# Patient Record
Sex: Male | Born: 1985 | Race: Black or African American | Hispanic: No | Marital: Married | Smoking: Smoker, current status unknown
Health system: Southern US, Community
[De-identification: ages and names within clinical notes are randomized; demographics above are authoritative.]

## PROBLEM LIST (undated history)

## (undated) DIAGNOSIS — M5430 Sciatica, unspecified side: Secondary | ICD-10-CM

## (undated) DIAGNOSIS — M5126 Other intervertebral disc displacement, lumbar region: Secondary | ICD-10-CM

---
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
## 2009-12-08 ENCOUNTER — Emergency Department (HOSPITAL_COMMUNITY): Admission: EM | Admit: 2009-12-08 | Discharge: 2009-12-08 | Payer: Self-pay | Admitting: Family Medicine

## 2010-01-26 ENCOUNTER — Emergency Department (HOSPITAL_COMMUNITY): Admission: EM | Admit: 2010-01-26 | Discharge: 2010-01-26 | Payer: Self-pay | Admitting: Emergency Medicine

## 2014-06-03 ENCOUNTER — Emergency Department (HOSPITAL_COMMUNITY): Payer: Self-pay

## 2014-06-03 ENCOUNTER — Encounter (HOSPITAL_COMMUNITY): Payer: Self-pay | Admitting: Emergency Medicine

## 2014-06-03 ENCOUNTER — Emergency Department (HOSPITAL_COMMUNITY)
Admission: EM | Admit: 2014-06-03 | Discharge: 2014-06-03 | Disposition: A | Discharge status: Home / Self Care | Payer: Self-pay | Attending: Emergency Medicine | Admitting: Emergency Medicine

## 2014-06-03 DIAGNOSIS — Y93G1 Activity, food preparation and clean up: Secondary | ICD-10-CM | POA: Insufficient documentation

## 2014-06-03 DIAGNOSIS — S61411A Laceration without foreign body of right hand, initial encounter: Secondary | ICD-10-CM

## 2014-06-03 DIAGNOSIS — S41109A Unspecified open wound of unspecified upper arm, initial encounter: Secondary | ICD-10-CM | POA: Insufficient documentation

## 2014-06-03 DIAGNOSIS — S61409A Unspecified open wound of unspecified hand, initial encounter: Secondary | ICD-10-CM | POA: Insufficient documentation

## 2014-06-03 DIAGNOSIS — S61209A Unspecified open wound of unspecified finger without damage to nail, initial encounter: Secondary | ICD-10-CM | POA: Insufficient documentation

## 2014-06-03 DIAGNOSIS — W268XXA Contact with other sharp object(s), not elsewhere classified, initial encounter: Secondary | ICD-10-CM | POA: Insufficient documentation

## 2014-06-03 DIAGNOSIS — Y9289 Other specified places as the place of occurrence of the external cause: Secondary | ICD-10-CM | POA: Insufficient documentation

## 2014-06-03 DIAGNOSIS — Z23 Encounter for immunization: Secondary | ICD-10-CM | POA: Insufficient documentation

## 2014-06-03 MED ORDER — CEPHALEXIN 500 MG PO CAPS: 500.0000 mg | ORAL_CAPSULE | Freq: Two times a day (BID) | ORAL | Status: DC

## 2014-06-03 MED ORDER — TETANUS-DIPHTH-ACELL PERTUSSIS 5-2.5-18.5 LF-MCG/0.5 IM SUSP
0.5000 mL | Freq: Once | INTRAMUSCULAR | Status: AC
  Administered 2014-06-03: 0.5 mL via INTRAMUSCULAR
  Filled 2014-06-03: qty 0.5

## 2014-06-03 NOTE — ED Notes (Signed)
Pt states he was washing a glass and it just busted in the water. Has laceration to right hand just below the thumb and to index finger. Small wound to top of hand also.

## 2014-06-03 NOTE — ED Notes (Signed)
Bacitracin applied and bandage applied to wounds.

## 2014-06-03 NOTE — ED Notes (Signed)
PA at the bedside.

## 2014-06-03 NOTE — Discharge Instructions (Signed)
Return to the emergency room with worsening of symptoms or with symptoms that are concerning, especially severe pain, warmth and redness or fevers  Keep wound dry and do not remove dressing for 24 hours if possible. After that, wash gently morning and night (every 12 hours) with soap and water. Use a topical antibiotic ointment and cover with a bandaid or gauze.    Do NOT use rubbing alcohol or hydrogen peroxide, do not soak the area   Present to your primary care doctor or the urgent care of your choice, or the ED for suture removal in 7-10 days.   Every attempt was made to remove foreign body (contaminants) from the wound.  However, there is always a chance that some may remain in the wound. This can  increase your risk of infection.   If you see signs of infection (warmth, redness, tenderness, pus, sharp increase in pain, fever, red streaking in the skin) immediately return to the emergency department.   After the wound heals fully, apply sunscreen for 6-12 months to minimize scarring.

## 2014-06-03 NOTE — ED Provider Notes (Signed)
CSN: [id number]     Arrival date & time 06/03/14  1043 History   This chart was scribed for non-physician practitioner Oswaldo ConroyVictoria Jalise Zawistowski, PA-C, working with Samuel JesterKathleen McManus, DO, by Yevette EdwardsAngela Bracken, ED Scribe. This patient was seen in room TR08C/TR08C and the patient's care was started at 11:02 AM.   First MD Initiated Contact with Patient 06/03/14 1046     Chief Complaint  Patient presents with  . Extremity Laceration   The history is provided by the patient. No language interpreter was used.    HPI Comments: Jeffery SicksJerren M Michna is a 28 y.o. male who presents to the Emergency Department complaining of a laceration to his right hand which occurred this morning while washing a glass dish. He reports pain which he rates as 7/10 associated with the laceration. He denies pain to his wrist or pain radiating up the arm. The pt is able to flex and extend his right hand and all fingers. The bleeding is well-controlled. He is unsure of his last tetanus. Patient denies fevers or weakness.  History reviewed. No pertinent past medical history. History reviewed. No pertinent past surgical history. No family history on file. History  Substance Use Topics  . Smoking status: Never Smoker   . Smokeless tobacco: Not on file  . Alcohol Use: Yes     Comment: occ    Review of Systems  Musculoskeletal: Negative for joint swelling.  Skin: Negative for wound.  Neurological: Negative for weakness and numbness.    A complete 10 system review of systems was obtained, and all systems were negative except where indicated in the HPI and PE.    Allergies  Review of patient's allergies indicates no known allergies.  Home Medications   Prior to Admission medications   Medication Sig Start Date End Date Taking? Authorizing Provider  cephALEXin (KEFLEX) 500 MG capsule Take 1 capsule (500 mg total) by mouth 2 (two) times daily. 06/03/14   Louann SjogrenVictoria L Capri Raben, PA-C   Triage Vitals:  BP 96/69  Pulse 54  Temp(Src) 97.6  F (36.4 C) (Oral)  Resp 20  Ht 5\' 7"  (1.702 m)  Wt 170 lb (77.111 kg)  BMI 26.62 kg/m2  SpO2 98%  Physical Exam  Nursing note and vitals reviewed. Constitutional: He is oriented to person, place, and time. He appears well-developed and well-nourished. No distress.  HENT:  Head: Normocephalic and atraumatic.  Eyes: Conjunctivae and EOM are normal. Right eye exhibits no discharge. Left eye exhibits no discharge. No scleral icterus.  Neck: Neck supple.  Cardiovascular:  2+ radial pulses  Pulmonary/Chest: Effort normal. No respiratory distress.  Musculoskeletal: Normal range of motion.  Right fingers and wrist 5/5 strength with flexion and extension. Moves all fingers.  Neurovascularly intact. Cap refill less than 3 seconds.  Neurological: He is alert and oriented to person, place, and time. Coordination normal.  Skin: Skin is warm and dry. He is not diaphoretic.  2 cm linear laceration to the web space between the right thumb and second finger.  5mm superficial laceration to the dorsal right hand.  5 cm stellate laceration to dorsal right second finger. No warmth or red streaks associated with the wound.  Minimal swelling. No gross contamination or visible foreign bodies.  Psychiatric: He has a normal mood and affect. His behavior is normal.    ED Course  Procedures (including critical care time)  DIAGNOSTIC STUDIES:  Oxygen Saturation is 98% on room air, normal by my interpretation.    COORDINATION OF CARE:  11:09 AM- Discussed treatment plan with patient, and the patient agreed to the plan. The plan includes imaging, suturing, and a tetanus booster. Will also prescribe pt an antibiotic.   12:13 PM- LACERATION REPAIR PROCEDURE NOTE The patient's identification was confirmed and consent was obtained. This procedure was performed by Oswaldo Conroy, PA-C, at 12:13 PM. Site: Web of right hand between thumb and second finger Sterile procedures observe Anesthetic used (type  and amt): 2% lidocaine; 5 mL Suture type/size: 5.0 Prolene  Length: 2 cm  # of Sutures: 3 Technique :simple interrupted  Complexity: simple  Antibx ointment applied Tetanus ordered.  Site anesthetized, irrigated with NS, explored without evidence of foreign body, wound well approximated, site covered with dry, sterile dressing.  Patient tolerated procedure well without complications. Instructions for care discussed verbally and patient provided with additional written instructions for homecare and f/u.  12:18 PM- LACERATION REPAIR PROCEDURE NOTE The patient's identification was confirmed and consent was obtained. This procedure was performed by Oswaldo Conroy, PA-C, at 12:18 PM. Site: Dorsum of right index finger Sterile procedures observed Anesthetic used (type and amt): 2% lidocaine; 5 mL Suture type/size:5.0 Prolene  Length: 3 cm  # of Sutures: 5 Technique:simple interrupted  Complexity: simple  Antibx ointment applied Tetanus ordered.  Site anesthetized, irrigated with NS, explored without evidence of foreign body, wound well approximated, site covered with dry, sterile dressing.  Patient tolerated procedure well without complications. Instructions for care discussed verbally and patient provided with additional written instructions for homecare and f/u.  Labs Review Labs Reviewed - No data to display  Imaging Review Dg Hand Complete Right  06/03/2014   CLINICAL DATA:  Right hand lacerations.  EXAM: RIGHT HAND - COMPLETE 3+ VIEW  COMPARISON:  None.  FINDINGS: Imaged bones, joints and soft tissues appear normal. No radiopaque foreign body is identified.  IMPRESSION: Negative exam.   Electronically Signed   By: Drusilla Kanner M.D.   On: 06/03/2014 11:38     EKG Interpretation None     Meds given in ED:  Medications  Tdap (BOOSTRIX) injection 0.5 mL (0.5 mLs Intramuscular Given 06/03/14 1110)    There are no discharge medications for this patient.     MDM    Final diagnoses:  Hand laceration, right, initial encounter   Patient presents after washing a glass that shattered with 5cm laceration to dorsal right second finger, 2 cm lac to webspace between right thumb and second finger and 5mm lac to dorsal hand. Xray without fracture or foreign body identified. Larger two lacerations were anesthetized, irrigated and sutured. The smaller dorsal 5 mm lac was irrigated but did not require suturing. Patient unsure of last tdap. Administered today in ED. Keflex antibiotics prophylactic script provided. Follow up with urgent care in 7-10 days for suture removal.  Discussed return precautions with patient. Discussed all results and patient verbalizes understanding and agrees with plan.  I personally performed the services described in this documentation, which was scribed in my presence. The recorded information has been reviewed and is accurate.    Louann Sjogren, PA-C 06/03/14 2043

## 2014-06-05 NOTE — ED Provider Notes (Signed)
Medical screening examination/treatment/procedure(s) were performed by non-physician practitioner and as supervising physician I was immediately available for consultation/collaboration.   EKG Interpretation None        Taje Littler, DO 06/05/14 1556 

## 2014-12-18 ENCOUNTER — Emergency Department (HOSPITAL_COMMUNITY)
Admission: EM | Admit: 2014-12-18 | Discharge: 2014-12-18 | Disposition: A | Discharge status: Home / Self Care | Payer: Self-pay | Attending: Emergency Medicine | Admitting: Emergency Medicine

## 2014-12-18 ENCOUNTER — Encounter (HOSPITAL_COMMUNITY): Payer: Self-pay | Admitting: *Deleted

## 2014-12-18 DIAGNOSIS — J069 Acute upper respiratory infection, unspecified: Secondary | ICD-10-CM | POA: Insufficient documentation

## 2014-12-18 DIAGNOSIS — M791 Myalgia: Secondary | ICD-10-CM | POA: Insufficient documentation

## 2014-12-18 LAB — RAPID STREP SCREEN (MED CTR MEBANE ONLY): Streptococcus, Group A Screen (Direct): NEGATIVE

## 2014-12-18 MED ORDER — GUAIFENESIN 100 MG/5ML PO LIQD: 100.0000 mg | ORAL | Status: DC | PRN

## 2014-12-18 MED ORDER — DEXTROMETHORPHAN-BENZOCAINE 5-7.5 MG MT LOZG: 1.0000 | LOZENGE | Freq: Three times a day (TID) | OROMUCOSAL | Status: DC | PRN

## 2014-12-18 NOTE — ED Provider Notes (Signed)
CSN: [id number]     Arrival date & time 12/18/14  1310 History  This chart was scribed for non-physician practitioner, Fayrene HelperBowie Taliana Mersereau, PA-C,working with Flint MelterElliott L Wentz, MD, by Karle PlumberJennifer Tensley, ED Scribe. This patient was seen in room WTR8/WTR8 and the patient's care was started at 3:45 PM.  Chief Complaint  Patient presents with  . Sore Throat  . Generalized Body Aches   Patient is a 29 y.o. male presenting with pharyngitis. The history is provided by the patient and medical records. No language interpreter was used.  Sore Throat    HPI Comments:  Jeffery SicksJerren M Watts is a 29 y.o. male who presents to the Emergency Department complaining of moderate sore throat that began last night. He reports associated chills, generalized body aches and mildly productive cough. He states he has been around small children lately and may have contracted something from them. He has been taking NyQuil last night and DayQuil earlier today with minimal relief of the symptoms. He reports using a throat spray as well with only minimal relief. Denies modifying factors. Denies fever, otalgia, ear discharge, difficulty swallowing, sneezing, nausea, diarrhea or vomiting. Endorses smoking.  History reviewed. No pertinent past medical history. History reviewed. No pertinent past surgical history. No family history on file. History  Substance Use Topics  . Smoking status: Never Smoker   . Smokeless tobacco: Not on file  . Alcohol Use: Yes     Comment: occ    Review of Systems  Constitutional: Positive for chills. Negative for fever.  HENT: Positive for sore throat. Negative for ear discharge, ear pain and trouble swallowing.   Respiratory: Positive for cough.   Gastrointestinal: Negative for nausea, vomiting and diarrhea.  Musculoskeletal: Positive for myalgias (generalized body aches).    Allergies  Review of patient's allergies indicates no known allergies.  Home Medications   Prior to Admission medications    Medication Sig Start Date End Date Taking? Authorizing Provider  DM-Phenylephrine-Acetaminophen 10-5-325 MG/15ML LIQD Take 15 mLs by mouth every 6 (six) hours as needed (cold/flu symptoms.).   Yes Historical Provider, MD  cephALEXin (KEFLEX) 500 MG capsule Take 1 capsule (500 mg total) by mouth 2 (two) times daily. Patient not taking: Reported on 12/18/2014 06/03/14   Louann SjogrenVictoria L Creech, PA-C   Triage Vitals: BP 137/75 mmHg  Pulse 79  Temp(Src) 99.5 F (37.5 C) (Oral)  Resp 18  SpO2 99% Physical Exam  Constitutional: He is oriented to person, place, and time. He appears well-developed and well-nourished.  HENT:  Head: Normocephalic and atraumatic.  Right Ear: Tympanic membrane and ear canal normal.  Left Ear: Tympanic membrane and ear canal normal.  Mouth/Throat: Uvula is midline and mucous membranes are normal. No trismus in the jaw. Oropharyngeal exudate present. No tonsillar abscesses.  Mild tonsillar enlargement with exudate bilaterally. Normal voice.  Eyes: EOM are normal.  Neck: Normal range of motion.  Cardiovascular: Normal rate.   Pulmonary/Chest: Effort normal.  Abdominal: There is no splenomegaly.  Musculoskeletal: Normal range of motion.  Neurological: He is alert and oriented to person, place, and time.  Skin: Skin is warm and dry.  Psychiatric: He has a normal mood and affect. His behavior is normal.  Nursing note and vitals reviewed.   ED Course  Procedures (including critical care time) DIAGNOSTIC STUDIES: Oxygen Saturation is 99% on RA, normal by my interpretation.   COORDINATION OF CARE: 3:50 PM- Will give referral to ENT if symptoms persist. Will treat symptomatically. Pt verbalizes understanding and agrees to  plan. Suspect viral etiology.  Doubt deep tissue infection.    Medications - No data to display  Labs Review Labs Reviewed  RAPID STREP SCREEN  CULTURE, GROUP A STREP    Imaging Review No results found.   EKG Interpretation None      MDM    Final diagnoses:  URI (upper respiratory infection)    BP 137/75 mmHg  Pulse 79  Temp(Src) 99.5 F (37.5 C) (Oral)  Resp 18  SpO2 99%   I personally performed the services described in this documentation, which was scribed in my presence. The recorded information has been reviewed and is accurate.    Fayrene Helper, PA-C 12/18/14 1553  Flint Melter, MD 12/18/14 [phone]

## 2014-12-18 NOTE — Discharge Instructions (Signed)
Upper Respiratory Infection, Adult An upper respiratory infection (URI) is also sometimes known as the common cold. The upper respiratory tract includes the nose, sinuses, throat, trachea, and bronchi. Bronchi are the airways leading to the lungs. Most people improve within 1 week, but symptoms can last up to 2 weeks. A residual cough may last even longer.  CAUSES Many different viruses can infect the tissues lining the upper respiratory tract. The tissues become irritated and inflamed and often become very moist. Mucus production is also common. A cold is contagious. You can easily spread the virus to others by oral contact. This includes kissing, sharing a glass, coughing, or sneezing. Touching your mouth or nose and then touching a surface, which is then touched by another person, can also spread the virus. SYMPTOMS  Symptoms typically develop 1 to 3 days after you come in contact with a cold virus. Symptoms vary from person to person. They may include:  Runny nose.  Sneezing.  Nasal congestion.  Sinus irritation.  Sore throat.  Loss of voice (laryngitis).  Cough.  Fatigue.  Muscle aches.  Loss of appetite.  Headache.  Low-grade fever. DIAGNOSIS  You might diagnose your own cold based on familiar symptoms, since most people get a cold 2 to 3 times a year. Your caregiver can confirm this based on your exam. Most importantly, your caregiver can check that your symptoms are not due to another disease such as strep throat, sinusitis, pneumonia, asthma, or epiglottitis. Blood tests, throat tests, and X-rays are not necessary to diagnose a common cold, but they may sometimes be helpful in excluding other more serious diseases. Your caregiver will decide if any further tests are required. RISKS AND COMPLICATIONS  You may be at risk for a more severe case of the common cold if you smoke cigarettes, have chronic heart disease (such as heart failure) or lung disease (such as asthma), or if  you have a weakened immune system. The very young and very old are also at risk for more serious infections. Bacterial sinusitis, middle ear infections, and bacterial pneumonia can complicate the common cold. The common cold can worsen asthma and chronic obstructive pulmonary disease (COPD). Sometimes, these complications can require emergency medical care and may be life-threatening. PREVENTION  The best way to protect against getting a cold is to practice good hygiene. Avoid oral or hand contact with people with cold symptoms. Wash your hands often if contact occurs. There is no clear evidence that vitamin C, vitamin E, echinacea, or exercise reduces the chance of developing a cold. However, it is always recommended to get plenty of rest and practice good nutrition. TREATMENT  Treatment is directed at relieving symptoms. There is no cure. Antibiotics are not effective, because the infection is caused by a virus, not by bacteria. Treatment may include:  Increased fluid intake. Sports drinks offer valuable electrolytes, sugars, and fluids.  Breathing heated mist or steam (vaporizer or shower).  Eating chicken soup or other clear broths, and maintaining good nutrition.  Getting plenty of rest.  Using gargles or lozenges for comfort.  Controlling fevers with ibuprofen or acetaminophen as directed by your caregiver.  Increasing usage of your inhaler if you have asthma. Zinc gel and zinc lozenges, taken in the first 24 hours of the common cold, can shorten the duration and lessen the severity of symptoms. Pain medicines may help with fever, muscle aches, and throat pain. A variety of non-prescription medicines are available to treat congestion and runny nose. Your caregiver   can make recommendations and may suggest nasal or lung inhalers for other symptoms.  HOME CARE INSTRUCTIONS   Only take over-the-counter or prescription medicines for pain, discomfort, or fever as directed by your  caregiver.  Use a warm mist humidifier or inhale steam from a shower to increase air moisture. This may keep secretions moist and make it easier to breathe.  Drink enough water and fluids to keep your urine clear or pale yellow.  Rest as needed.  Return to work when your temperature has returned to normal or as your caregiver advises. You may need to stay home longer to avoid infecting others. You can also use a face mask and careful hand washing to prevent spread of the virus. SEEK MEDICAL CARE IF:   After the first few days, you feel you are getting worse rather than better.  You need your caregiver's advice about medicines to control symptoms.  You develop chills, worsening shortness of breath, or brown or red sputum. These may be signs of pneumonia.  You develop yellow or brown nasal discharge or pain in the face, especially when you bend forward. These may be signs of sinusitis.  You develop a fever, swollen neck glands, pain with swallowing, or white areas in the back of your throat. These may be signs of strep throat. SEEK IMMEDIATE MEDICAL CARE IF:   You have a fever.  You develop severe or persistent headache, ear pain, sinus pain, or chest pain.  You develop wheezing, a prolonged cough, cough up blood, or have a change in your usual mucus (if you have chronic lung disease).  You develop sore muscles or a stiff neck. Document Released: 03/27/2001 Document Revised: 12/24/2011 Document Reviewed: 01/06/2014 [employer] Patient Information 2015 [employer]. This information is not intended to replace advice given to you by your health care provider. Make sure you discuss any questions you have with your health care provider.  

## 2014-12-18 NOTE — ED Notes (Signed)
Has history of strep, now has difficulty swallowing and gen body aches

## 2014-12-20 LAB — CULTURE, GROUP A STREP: Strep A Culture: NEGATIVE

## 2017-04-25 ENCOUNTER — Emergency Department (HOSPITAL_COMMUNITY): Payer: Self-pay

## 2017-04-25 ENCOUNTER — Emergency Department (HOSPITAL_COMMUNITY)
Admission: EM | Admit: 2017-04-25 | Discharge: 2017-04-25 | Disposition: A | Discharge status: Home / Self Care | Payer: Self-pay | Attending: Emergency Medicine | Admitting: Emergency Medicine

## 2017-04-25 ENCOUNTER — Encounter (HOSPITAL_COMMUNITY): Payer: Self-pay | Admitting: Emergency Medicine

## 2017-04-25 DIAGNOSIS — L247 Irritant contact dermatitis due to plants, except food: Secondary | ICD-10-CM | POA: Insufficient documentation

## 2017-04-25 DIAGNOSIS — Y999 Unspecified external cause status: Secondary | ICD-10-CM | POA: Insufficient documentation

## 2017-04-25 DIAGNOSIS — M542 Cervicalgia: Secondary | ICD-10-CM | POA: Insufficient documentation

## 2017-04-25 DIAGNOSIS — Y929 Unspecified place or not applicable: Secondary | ICD-10-CM | POA: Insufficient documentation

## 2017-04-25 DIAGNOSIS — Y939 Activity, unspecified: Secondary | ICD-10-CM | POA: Insufficient documentation

## 2017-04-25 DIAGNOSIS — M545 Low back pain: Secondary | ICD-10-CM | POA: Insufficient documentation

## 2017-04-25 DIAGNOSIS — M25522 Pain in left elbow: Secondary | ICD-10-CM | POA: Insufficient documentation

## 2017-04-25 MED ORDER — CALAMINE EX LOTN: 1.0000 "application " | TOPICAL_LOTION | CUTANEOUS | 0 refills | Status: DC | PRN

## 2017-04-25 MED ORDER — IBUPROFEN 200 MG PO TABS
600.0000 mg | ORAL_TABLET | Freq: Once | ORAL | Status: AC
  Administered 2017-04-25: 600 mg via ORAL
  Filled 2017-04-25: qty 1

## 2017-04-25 NOTE — ED Notes (Signed)
Patient transported to X-ray 
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
## 2017-04-30 ENCOUNTER — Emergency Department (HOSPITAL_COMMUNITY)
Admission: EM | Admit: 2017-04-30 | Discharge: 2017-04-30 | Disposition: A | Discharge status: Home / Self Care | Payer: Self-pay | Attending: Emergency Medicine | Admitting: Emergency Medicine

## 2017-04-30 ENCOUNTER — Encounter (HOSPITAL_COMMUNITY): Payer: Self-pay | Admitting: Emergency Medicine

## 2017-04-30 DIAGNOSIS — L247 Irritant contact dermatitis due to plants, except food: Secondary | ICD-10-CM

## 2017-04-30 DIAGNOSIS — L03116 Cellulitis of left lower limb: Secondary | ICD-10-CM

## 2017-04-30 DIAGNOSIS — F17210 Nicotine dependence, cigarettes, uncomplicated: Secondary | ICD-10-CM | POA: Insufficient documentation

## 2017-04-30 MED ORDER — HYDROCORTISONE 2.5 % EX LOTN: TOPICAL_LOTION | Freq: Two times a day (BID) | CUTANEOUS | 0 refills | Status: DC

## 2017-04-30 MED ORDER — CEPHALEXIN 500 MG PO CAPS: 500.0000 mg | ORAL_CAPSULE | Freq: Three times a day (TID) | ORAL | 0 refills | Status: AC
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
## 2018-03-29 ENCOUNTER — Inpatient Hospital Stay (HOSPITAL_COMMUNITY)
Admission: AD | Admit: 2018-03-29 | Discharge: 2018-04-01 | DRG: 885 | Disposition: A | Discharge status: Home / Self Care | Payer: Federal, State, Local not specified - Other | Source: Intra-hospital | Attending: Psychiatry | Admitting: Psychiatry

## 2018-03-29 ENCOUNTER — Encounter (HOSPITAL_COMMUNITY): Payer: Self-pay | Admitting: *Deleted

## 2018-03-29 ENCOUNTER — Emergency Department (HOSPITAL_COMMUNITY)
Admission: EM | Admit: 2018-03-29 | Discharge: 2018-03-29 | Disposition: A | Discharge status: Other Acute Inpatient Hospital | Payer: Self-pay | Attending: Emergency Medicine | Admitting: Emergency Medicine

## 2018-03-29 DIAGNOSIS — F1020 Alcohol dependence, uncomplicated: Secondary | ICD-10-CM | POA: Insufficient documentation

## 2018-03-29 DIAGNOSIS — F1994 Other psychoactive substance use, unspecified with psychoactive substance-induced mood disorder: Secondary | ICD-10-CM | POA: Diagnosis not present

## 2018-03-29 DIAGNOSIS — F4310 Post-traumatic stress disorder, unspecified: Secondary | ICD-10-CM | POA: Diagnosis present

## 2018-03-29 DIAGNOSIS — F322 Major depressive disorder, single episode, severe without psychotic features: Principal | ICD-10-CM | POA: Diagnosis present

## 2018-03-29 DIAGNOSIS — F1220 Cannabis dependence, uncomplicated: Secondary | ICD-10-CM | POA: Diagnosis present

## 2018-03-29 DIAGNOSIS — F1490 Cocaine use, unspecified, uncomplicated: Secondary | ICD-10-CM | POA: Diagnosis not present

## 2018-03-29 DIAGNOSIS — Z630 Problems in relationship with spouse or partner: Secondary | ICD-10-CM | POA: Diagnosis not present

## 2018-03-29 DIAGNOSIS — F1420 Cocaine dependence, uncomplicated: Secondary | ICD-10-CM | POA: Diagnosis present

## 2018-03-29 DIAGNOSIS — Z599 Problem related to housing and economic circumstances, unspecified: Secondary | ICD-10-CM | POA: Diagnosis not present

## 2018-03-29 DIAGNOSIS — F323 Major depressive disorder, single episode, severe with psychotic features: Secondary | ICD-10-CM | POA: Insufficient documentation

## 2018-03-29 DIAGNOSIS — Z653 Problems related to other legal circumstances: Secondary | ICD-10-CM | POA: Diagnosis not present

## 2018-03-29 DIAGNOSIS — F1290 Cannabis use, unspecified, uncomplicated: Secondary | ICD-10-CM | POA: Diagnosis not present

## 2018-03-29 DIAGNOSIS — Z886 Allergy status to analgesic agent status: Secondary | ICD-10-CM | POA: Diagnosis not present

## 2018-03-29 DIAGNOSIS — G4700 Insomnia, unspecified: Secondary | ICD-10-CM | POA: Diagnosis present

## 2018-03-29 DIAGNOSIS — R41843 Psychomotor deficit: Secondary | ICD-10-CM | POA: Diagnosis present

## 2018-03-29 DIAGNOSIS — F17290 Nicotine dependence, other tobacco product, uncomplicated: Secondary | ICD-10-CM | POA: Diagnosis present

## 2018-03-29 DIAGNOSIS — F17210 Nicotine dependence, cigarettes, uncomplicated: Secondary | ICD-10-CM | POA: Insufficient documentation

## 2018-03-29 DIAGNOSIS — R45851 Suicidal ideations: Secondary | ICD-10-CM | POA: Diagnosis present

## 2018-03-29 LAB — ACETAMINOPHEN LEVEL: Acetaminophen (Tylenol), Serum: <10 ug/mL — ABNORMAL LOW (ref 10–30)

## 2018-03-29 LAB — CBC
HCT: 44.7 % (ref 39.0–52.0)
HEMOGLOBIN: 15.4 g/dL (ref 13.0–17.0)
MCH: 30.6 pg (ref 26.0–34.0)
MCHC: 34.5 g/dL (ref 30.0–36.0)
MCV: 88.9 fL (ref 78.0–100.0)
PLATELETS: 312 10*3/uL (ref 150–400)
RBC: 5.03 MIL/uL (ref 4.22–5.81)
RDW: 12.7 % (ref 11.5–15.5)
WBC: 6 10*3/uL (ref 4.0–10.5)

## 2018-03-29 LAB — COMPREHENSIVE METABOLIC PANEL
ALBUMIN: 4 g/dL (ref 3.5–5.0)
ALT: 24 U/L (ref 17–63)
AST: 33 U/L (ref 15–41)
Alkaline Phosphatase: 55 U/L (ref 38–126)
Anion gap: 9 (ref 5–15)
BUN: 14 mg/dL (ref 6–20)
CHLORIDE: 107 mmol/L (ref 101–111)
CO2: 26 mmol/L (ref 22–32)
Calcium: 9.1 mg/dL (ref 8.9–10.3)
Creatinine, Ser: 1.2 mg/dL (ref 0.61–1.24)
GFR calc Af Amer: >60 mL/min (ref >60)
GLUCOSE: 83 mg/dL (ref 65–99)
POTASSIUM: 3.9 mmol/L (ref 3.5–5.1)
SODIUM: 142 mmol/L (ref 135–145)
Total Bilirubin: 0.6 mg/dL (ref 0.3–1.2)
Total Protein: 6.8 g/dL (ref 6.5–8.1)

## 2018-03-29 LAB — RAPID URINE DRUG SCREEN, HOSP PERFORMED
AMPHETAMINES: NOT DETECTED
BENZODIAZEPINES: NOT DETECTED
Cocaine: POSITIVE — AB
Opiates: NOT DETECTED
Tetrahydrocannabinol: POSITIVE — AB

## 2018-03-29 LAB — ETHANOL

## 2018-03-29 LAB — SALICYLATE LEVEL: Salicylate Lvl: <7 mg/dL (ref 2.8–30.0)

## 2018-03-29 MED ORDER — NICOTINE 21 MG/24HR TD PT24: 21.0000 mg | MEDICATED_PATCH | Freq: Every day | TRANSDERMAL | Status: DC

## 2018-03-29 MED ORDER — TRAZODONE HCL 100 MG PO TABS
100.0000 mg | ORAL_TABLET | Freq: Every evening | ORAL | Status: DC | PRN
  Administered 2018-03-29: 100 mg via ORAL
  Filled 2018-03-29 (×5): qty 1

## 2018-03-29 MED ORDER — DICYCLOMINE HCL 20 MG PO TABS: 20.0000 mg | ORAL_TABLET | Freq: Four times a day (QID) | ORAL | Status: DC | PRN

## 2018-03-29 MED ORDER — LOPERAMIDE HCL 2 MG PO CAPS: 2.0000 mg | ORAL_CAPSULE | ORAL | Status: DC | PRN

## 2018-03-29 MED ORDER — ONDANSETRON 4 MG PO TBDP: 4.0000 mg | ORAL_TABLET | Freq: Four times a day (QID) | ORAL | Status: DC | PRN

## 2018-03-29 MED ORDER — HYDROXYZINE HCL 25 MG PO TABS
25.0000 mg | ORAL_TABLET | Freq: Four times a day (QID) | ORAL | Status: DC | PRN
  Administered 2018-03-29 – 2018-03-31 (×3): 25 mg via ORAL
  Filled 2018-03-29 (×3): qty 1

## 2018-03-29 MED ORDER — IBUPROFEN 200 MG PO TABS: 600.0000 mg | ORAL_TABLET | Freq: Three times a day (TID) | ORAL | Status: DC | PRN

## 2018-03-29 MED ORDER — MAGNESIUM HYDROXIDE 400 MG/5ML PO SUSP: 30.0000 mL | Freq: Every day | ORAL | Status: DC | PRN

## 2018-03-29 MED ORDER — ALUM & MAG HYDROXIDE-SIMETH 200-200-20 MG/5ML PO SUSP: 30.0000 mL | ORAL | Status: DC | PRN

## 2018-03-29 MED ORDER — METHOCARBAMOL 500 MG PO TABS
500.0000 mg | ORAL_TABLET | Freq: Three times a day (TID) | ORAL | Status: DC | PRN
Start: 2018-03-29 — End: 2018-04-01

## 2018-03-29 NOTE — Progress Notes (Signed)
TTS consulted with Donell SievertSpencer Simon, PA who recommends inpt treatment. [facility] to review for possible placement. Pt's nurse Winfield CunasSadler, Michelle T, RN and EDP Dietrich PatesKhatri, Hina, PA-C advised of the disposition. TTS will continue to monitor.  Princess BruinsAquicha Tevis Dunavan, MSW, LCSW Therapeutic Triage Specialist  [phone]

## 2018-03-29 NOTE — Progress Notes (Signed)
[facility] Attending provider, Dr. Jama Flavorsobos, MD. Call to report [phone]. Contact AC to coordinate time of transfer. Marcelino DusterMichelle, RN notified. [facility].  Jeffery Watts, MSW, LCSW Therapeutic Triage Specialist  [phone]

## 2018-03-29 NOTE — ED Notes (Signed)
Patient approached nursing station and inquired about the length of stay and that he wanted to talk to the assessment counselor. Pt pleasant and cooperative with care. Pt given a sandwich, snacks and soda upon request. No distress noted.

## 2018-03-29 NOTE — ED Triage Notes (Signed)
Pt states he has felt like harming himself for the past couple of days. Pt states he has depression, anxiety, PTSD. Pt called suicide prevention line and was told he should come to ED. Pt states he has never been on psychiatric medication or seen for suicidal ideation.

## 2018-03-29 NOTE — ED Notes (Signed)
Bed: [facility] Expected date:  Expected time:  Means of arrival:  Comments: Hold for triage 3 

## 2018-03-29 NOTE — ED Provider Notes (Signed)
[facility]-EMERGENCY DEPT Provider Note   CSN: [id number] Arrival date & time: 03/29/18  1715     History   Chief Complaint Chief Complaint  Patient presents with  . Suicidal    HPI Philippa SicksJerren M Pogue is a 32 y.o. male who presents to ED for evaluation of suicidal ideations.  He states that he has been having worsening of his depression, PTSD in the past 2 days.  Patient is vague about what exactly has been causing worsening of his symptoms.  He tells me that he has an apparent court case going on for his drug use, as well as problems in his current marriage, and his PTSD from being in prison for several years.  He tells me that he has a plan to harm himself but does not tell me what the specific plan is.  He denies access to firearms.  He denies any auditory or visual hallucinations but states that sometimes I think my conscience is telling me to do things."  He denies any prior suicidal ideations or attempts.  He has never been on any medications for his depression anxiety or PTSD.  He called the suicide prevention hotline and was told to come to the ED.  He currently has 2 children who he cares about.  He reports occasional alcohol and tobacco use, as well as occasional marijuana and cocaine use.  He denies any homicidal ideations, chest pain, shortness of breath, vomiting, other symptoms.  HPI  History reviewed. No pertinent past medical history.  There are no active problems to display for this patient.   History reviewed. No pertinent surgical history.      Home Medications    Prior to Admission medications   Medication Sig Start Date End Date Taking? Authorizing Provider  Multiple Vitamins-Minerals (ECHINACEA ACZ PO) Take 1 tablet by mouth daily.   Yes [provider]  calamine lotion Apply 1 application topically as needed for itching. Patient not taking: Reported on 03/29/2018 04/25/17   Pricilla LovelessGoldston, Scott, MD  Dextromethorphan-Benzocaine (CEPACOL  SORE THROAT & COUGH) 5-7.5 MG LOZG Use as directed 1 lozenge in the mouth or throat every 8 (eight) hours as needed (sore throat). Patient not taking: Reported on 04/25/2017 12/18/14   Fayrene Helperran, Bowie, PA-C  guaiFENesin (ROBITUSSIN) 100 MG/5ML liquid Take 5-10 mLs (100-200 mg total) by mouth every 4 (four) hours as needed for cough. Patient not taking: Reported on 04/25/2017 12/18/14   Fayrene Helperran, Bowie, PA-C  hydrocortisone 2.5 % lotion Apply topically 2 (two) times daily. Patient not taking: Reported on 03/29/2018 04/30/17   Nira Connardama, Pedro Eduardo, MD    Family History No family history on file.  Social History Social History   Tobacco Use  . Smoking status: Current Every Day Smoker    Types: Cigarettes  . Smokeless tobacco: Never Used  Substance Use Topics  . Alcohol use: Yes    Comment: occ  . Drug use: Yes    Types: Marijuana     Allergies   Tylenol [acetaminophen]   Review of Systems Review of Systems  Constitutional: Negative for appetite change, chills and fever.  HENT: Negative for ear pain, rhinorrhea, sneezing and sore throat.   Eyes: Negative for photophobia and visual disturbance.  Respiratory: Negative for cough, chest tightness, shortness of breath and wheezing.   Cardiovascular: Negative for chest pain and palpitations.  Gastrointestinal: Negative for abdominal pain, blood in stool, constipation, diarrhea, nausea and vomiting.  Genitourinary: Negative for dysuria, hematuria and urgency.  Musculoskeletal: Negative  for myalgias.  Skin: Negative for rash.  Neurological: Negative for dizziness, weakness and light-headedness.  Psychiatric/Behavioral: Positive for dysphoric mood and suicidal ideas.     Physical Exam Updated Vital Signs BP 120/80 (BP Location: Left Arm)   Pulse (!) 45   Temp 98.1 F (36.7 C) (Oral)   Resp 18   SpO2 99%   Physical Exam  Constitutional: He appears well-developed and well-nourished. No distress.  HENT:  Head: Normocephalic and  atraumatic.  Nose: Nose normal.  Eyes: Conjunctivae and EOM are normal. Left eye exhibits no discharge. No scleral icterus.  Neck: Normal range of motion. Neck supple.  Cardiovascular: Normal rate, regular rhythm, normal heart sounds and intact distal pulses. Exam reveals no gallop and no friction rub.  No murmur heard. Pulmonary/Chest: Effort normal and breath sounds normal. No respiratory distress.  Abdominal: Soft. Bowel sounds are normal. He exhibits no distension. There is no tenderness. There is no guarding.  Musculoskeletal: Normal range of motion. He exhibits no edema.  Neurological: He is alert. He exhibits normal muscle tone. Coordination normal.  Skin: Skin is warm and dry. No rash noted.  Psychiatric: He has a normal mood and affect. He expresses suicidal ideation. He expresses no homicidal ideation. He expresses suicidal plans. He expresses no homicidal plans.  Nursing note and vitals reviewed.    ED Treatments / Results  Labs (all labs ordered are listed, but only abnormal results are displayed) Labs Reviewed  COMPREHENSIVE METABOLIC PANEL  CBC  ETHANOL  SALICYLATE LEVEL  ACETAMINOPHEN LEVEL  RAPID URINE DRUG SCREEN, HOSP PERFORMED    EKG None  Radiology No results found.  Procedures Procedures (including critical care time)  Medications Ordered in ED Medications - No data to display   Initial Impression / Assessment and Plan / ED Course  I have reviewed the triage vital signs and the nursing notes.  Pertinent labs & imaging results that were available during my care of the patient were reviewed by me and considered in my medical decision making (see chart for details).     32 year old male presents for evaluate ideations.  States he has been having worsening of his depression, PTSD for the past 2 days.  He states that it is a combination of an ongoing court case regarding his drug use, problems in his current marriage and PTSD from being in prison for  several years.  States he has a plan but does not tell me what his specific plan is.  Denies any prior suicide attempts or any attempt at self-harm.  Denies any HI, AVH.  He has never been on any antidepressants or antianxiety medications.  He denies any other symptoms.  Medical screening lab work is unremarkable.  Patient is medically cleared for TTS evaluation.  We will set disposition based on TTS evaluation.  Portions of this note were generated with Scientist, clinical (histocompatibility and immunogenetics). Dictation errors may occur despite best attempts at proofreading.  Final Clinical Impressions(s) / ED Diagnoses   Final diagnoses:  None    ED Discharge Orders    None       Dietrich Pates, PA-C 03/29/18 1935    Little, Ambrose Finland, MD 03/30/18 1447

## 2018-03-29 NOTE — ED Notes (Signed)
Bed: [facility] Expected date:  Expected time:  Means of arrival:  Comments: 

## 2018-03-29 NOTE — ED Notes (Signed)
Patient with no signs of distress noted at this time. Patient spoke with his wife on the phone.
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
## 2018-03-30 ENCOUNTER — Other Ambulatory Visit: Payer: Self-pay

## 2018-03-30 ENCOUNTER — Encounter (HOSPITAL_COMMUNITY): Payer: Self-pay

## 2018-03-30 DIAGNOSIS — F322 Major depressive disorder, single episode, severe without psychotic features: Principal | ICD-10-CM

## 2018-03-30 DIAGNOSIS — Z630 Problems in relationship with spouse or partner: Secondary | ICD-10-CM

## 2018-03-30 DIAGNOSIS — F1490 Cocaine use, unspecified, uncomplicated: Secondary | ICD-10-CM

## 2018-03-30 DIAGNOSIS — F1290 Cannabis use, unspecified, uncomplicated: Secondary | ICD-10-CM

## 2018-03-30 DIAGNOSIS — F1994 Other psychoactive substance use, unspecified with psychoactive substance-induced mood disorder: Secondary | ICD-10-CM

## 2018-03-30 DIAGNOSIS — Z599 Problem related to housing and economic circumstances, unspecified: Secondary | ICD-10-CM

## 2018-03-30 MED ORDER — SERTRALINE HCL 50 MG PO TABS
50.0000 mg | ORAL_TABLET | Freq: Every day | ORAL | Status: DC
  Filled 2018-03-30 (×4): qty 1

## 2018-03-30 MED ORDER — TRAZODONE HCL 50 MG PO TABS
50.0000 mg | ORAL_TABLET | Freq: Every evening | ORAL | Status: DC | PRN
  Filled 2018-03-30: qty 1

## 2018-03-30 NOTE — Progress Notes (Signed)
Writer has observed patient on the phone a lot tonight, he did not attend AA and watched tv briefly before getting back on the phone until dayroom closed. He requested vistaril and not trazodone tonight which he received. Support given and safety maintained on unit with 15 min checks.

## 2018-03-30 NOTE — Progress Notes (Signed)
Pt visible on the miliue, attending groups and meals as scheduled.

## 2018-03-30 NOTE — Progress Notes (Signed)
Pt did not attend goals and orientation group this morning  
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
## 2018-03-30 NOTE — [facility] Suicide Risk Assessment (Addendum)
[facility]   Nursing information obtained from:  Patient Demographic factors:  Male Current Mental Status:  Suicidal ideation indicated by patient, Suicide plan Loss Factors:  Financial problems / change in socioeconomic status, Legal issues Historical Factors:  Family history of mental illness or substance abuse Risk Reduction Factors:  Employed, Responsible for children under 33 years of age, Sense of responsibility to family, Positive social support, Positive coping skills or problem solving skills  Total Time spent with patient: 45 minutes Principal Problem: Substance Abuse, Depression Diagnosis:   Patient Active Problem List   Diagnosis Date Noted  . Substance induced mood disorder [facility](HCC) [F19.94] 03/29/2018   Subjective Data:    Continued Clinical Symptoms:  Alcohol Use Disorder Identification Test Final Score (AUDIT): 6 The "Alcohol Use Disorders Identification Test", Guidelines for Use in Primary Care, Second Edition.  World Science writerHealth Organization [facility](WHO). Score between 0-7:  no or low risk or alcohol related problems. Score between 8-15:  moderate risk of alcohol related problems. Score between 16-19:  high risk of alcohol related problems. Score 20 or above:  warrants further diagnostic evaluation for alcohol dependence and treatment.   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  Presented to the hospital voluntarily reporting worsening depression.  Reports she has been facing significant stressors, mainly financial and marital.  He also reports upcoming court date.He states he has been having recent arguments with his wife and they have been talking about separating.  Reports she has been depressed for "a few days", which he attributes to above mentioned stressors-endorses neurovegetative symptoms to include poor sleep, low energy, mild to moderate anhedonia.  Reports vague suicidal ideations  without plan or intention. Admission blood alcohol level negative, admission urine drug screen positive for cocaine and cannabis.  He has a history of substance abuse and reports daily cannabis use, drinks 4 or 5 days out of the week up to 4, 5 beers per sitting.  States he uses  cocaine irregularly.  He denies any prior psychiatric admissions, denies history of suicide attempts or self-injurious behaviors, denies history of psychosis, denies history of violence or explosiveness, he denies prior history of severe depression  Denies medical history, no medical illnesses, reports he is allergic to acetaminophen.  He was not taking any medications prior to admission.  He is not currently presenting with any symptoms of withdrawal, no tremors, diaphoresis, no acute distress or discomfort  Diagnoses- Substance Induced Mood Disorder, Depressed, versus MDD .   Plan-  inpatient admission Will start Librium PRNs to address symptoms of alcohol withdrawal if needed. Start Zoloft 50 mg daily for depression/anxiety. Check EKG ( bradycardia)        Musculoskeletal: Strength & Muscle Tone: within normal limits-no tremors, no diaphoresis, no restlessness or acute distress Gait & Station: normal Patient leans: N/A  Psychiatric Specialty Exam: Physical Exam  ROS no chest pain, no shortness of breath, no vomiting  Blood pressure 110/73, pulse (!) 48, temperature 97.8 F (36.6 C), temperature source Oral, resp. rate 16, height 5\' 9"  (1.753 m), weight 81.6 kg (180 lb), SpO2 99 %.Body mass index is 26.58 kg/m.  General Appearance: Fairly Groomed  Eye Contact:  Fair  Speech:  Normal Rate  Volume:  Decreased  Mood:  Reports depression, feeling "a little better" today  Affect:  Restricted  Thought Process:  Linear and Descriptions of Associations: Intact  Orientation:  Other:  Fully alert and attentive  Thought Content:  Not internally preoccupied, no delusions expressed, no hallucinations   Suicidal Thoughts:  No today denies any suicidal ideations, denies self-injurious ideations, contracts for safety on unit. Denies homicidal or violent ideations  Homicidal Thoughts:  No  Memory:  Recent and remote grossly intact  Judgement:  Fair  Insight:  Fair  Psychomotor Activity:  Decreased-in bed  Concentration:  Concentration: Fair and Attention Span: Fair  Recall:  Good  Fund of Knowledge:  Good  Language:  Good  Akathisia:  Negative  Handed:  Right  AIMS (if indicated):     Assets:  Desire for Improvement Resilience  ADL's:  Intact  Cognition:  WNL  Sleep:  Number of Hours: 5      COGNITIVE FEATURES THAT CONTRIBUTE TO RISK:  Closed-mindedness and Loss of executive function    SUICIDE RISK:   Moderate:  Frequent suicidal ideation with limited intensity, and duration, some specificity in terms of plans, no associated intent, good self-control, limited dysphoria/symptomatology, some risk factors present, and identifiable protective factors, including available and accessible social support.  PLAN OF CARE: Patient will be admitted to inpatient psychiatric unit for stabilization and safety. Will provide and encourage milieu participation. Provide medication management and maked adjustments as needed.  Will follow daily.    I certify that inpatient services furnished can reasonably be expected to improve the patient's condition.   Craige Cotta, MD 03/30/2018, 2:26 PM
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
## 2018-03-30 NOTE — Progress Notes (Signed)
Patient did not attend the evening speaker AA meeting. Pt was notified that group was beginning but returned to his room.   
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
## 2018-03-30 NOTE — Tx Team (Signed)
Initial Treatment Plan 03/30/2018 1:34 AM Jeffery Watts [id number]    PATIENT STRESSORS: Financial difficulties Legal issue Substance abuse   PATIENT STRENGTHS: Ability for insight Active sense of humor Average or above average intelligence Motivation for treatment/growth Work skills   PATIENT IDENTIFIED PROBLEMS: SI  Depression  Anxiety      " To communicate better by getting stuff out."  " I want to learn more about mental illness."         DISCHARGE CRITERIA:  Improved stabilization in mood, thinking, and/or behavior  PRELIMINARY DISCHARGE PLAN: Return to previous living arrangement  PATIENT/FAMILY INVOLVEMENT: This treatment plan has been presented to and reviewed with the patient, Jeffery Watts, and/or family member.  The patient and family have been given the opportunity to ask questions and make suggestions.  Jeffery Watts, Jeffery Storrs, RN 03/30/2018, 1:34 AM

## 2018-03-30 NOTE — [facility] Group Notes (Signed)
[facility] Group Notes:  (Nursing)  Date:  03/30/2018  Time: 1:15 PM Type of Therapy:  Nurse Education  Participation Level:  Active  Participation Quality:  Appropriate  Affect:  Appropriate  Cognitive:  Appropriate  Insight:  Appropriate  Engagement in Group:  Engaged  Modes of Intervention:  Discussion and Exploration  Summary of Progress/Problems: Group completed suicide safety plan, identified common triggers, healthy coping skills, etc Jeffery NevinValerie Watts Jeffery Watts 03/30/2018, 2:42 PM

## 2018-03-30 NOTE — Progress Notes (Signed)
Pt presents with an anxious mood. Pt speech noted to be loud and pressured during shift assessment. Pt noted to have brief eye contact. Pt appeared bizarre during assessment. Pt verbalized to Clinical research associatewriter, "you got some smokes " and then laughed.  Pt reports decreased racing thoughts at the time of the assessment. Pt expressed that he was given some meds for his anxiety and felt a little drowsy. Pt denies any active suicidal thoughts. Pt isolative to his room and has not attended any scheduled groups.   Orders reviewed with pt. Medications administered as ordered per MD. Verbal support provided. Pt encouraged to attend groups. Suicide risk assessment completed. 15 minute checks performed for safety. Self inventory sheet reviewed.

## 2018-03-30 NOTE — [facility] Group Notes (Signed)
[facility] LCSW Group Therapy Note  03/30/2018  10:00-11:00AM  Type of Therapy and Topic:  Group Therapy:  Being Your Own Support  Participation Level:  Did Not Attend   Description of Group:  Patients in this group were introduced to the concept that self-support is an essential part of recovery.  A song entitled "My Own Hero" was played and a group discussion ensued in which patients stated they could relate to the song and it inspired them to realize they have be willing to help themselves in order to succeed, because other people cannot achieve sobriety or stability for them.  We discussed adding a variety of healthy supports to address the various needs in their lives.  A song was played called "I Know Where I've Been" toward the end of group and used to conduct an inspirational wrap-up to group of remembering how far they have already come in their journey.  Therapeutic Goals: 1)  demonstrate the importance of being a part of one's own support system 2)  discuss reasons people in one's life may eventually be unable to be continually supportive  3)  identify the patient's current support system and   4)  elicit commitments to add healthy supports and to become more conscious of being self-supportive   Summary of Patient Progress:  n/a   Therapeutic Modalities:   Motivational Interviewing Activity  Guilherme Schwenke J Grossman-Orr       

## 2018-03-31 DIAGNOSIS — Z653 Problems related to other legal circumstances: Secondary | ICD-10-CM

## 2018-03-31 MED ORDER — GABAPENTIN 100 MG PO CAPS
100.0000 mg | ORAL_CAPSULE | Freq: Two times a day (BID) | ORAL | Status: DC
  Administered 2018-03-31: 100 mg via ORAL
  Filled 2018-03-31 (×7): qty 1

## 2018-03-31 NOTE — Progress Notes (Signed)
Patient refused zoloft 50 mg this morning.  Patient stated "I don't take medicine during the day."  Patient in bed with head covered with blanket.

## 2018-03-31 NOTE — [facility] Group Notes (Signed)
LCSW Group Therapy Note   03/31/2018 1:15pm   Type of Therapy and Topic:  Group Therapy:  Overcoming Obstacles   Participation Level:  Did Not Attend--pt invited. Chose to remain in bed.    Description of Group:    In this group patients will be encouraged to explore what they see as obstacles to their own wellness and recovery. They will be guided to discuss their thoughts, feelings, and behaviors related to these obstacles. The group will process together ways to cope with barriers, with attention given to specific choices patients can make. Each patient will be challenged to identify changes they are motivated to make in order to overcome their obstacles. This group will be process-oriented, with patients participating in exploration of their own experiences as well as giving and receiving support and challenge from other group members.   Therapeutic Goals: 1. Patient will identify personal and current obstacles as they relate to admission. 2. Patient will identify barriers that currently interfere with their wellness or overcoming obstacles.  3. Patient will identify feelings, thought process and behaviors related to these barriers. 4. Patient will identify two changes they are willing to make to overcome these obstacles:      Summary of Patient Progress   x   Therapeutic Modalities:   Cognitive Behavioral Therapy Solution Focused Therapy Motivational Interviewing Relapse Prevention Therapy  Gustav Knueppel S Wayman Hoard, LCSW 03/31/2018 9:42 AM  

## 2018-03-31 NOTE — Plan of Care (Signed)
Nurse discussed depression, anxiety, coping skills with patient.  

## 2018-03-31 NOTE — Progress Notes (Signed)
[facility] MD Progress Note  03/31/2018 5:41 PM Jeffery Watts  MRN:  [id number] Subjective: Patient states " I am feeling all right".  Denies suicidal ideations . He is currently minimizes depression, denies suicidal ideations, does endorse anxiety and states he feels his major issue is anxiety more than mood disorder/depression. He is currently future oriented and reports being hopeful for discharge soon. Objective : case reviewed with staff and patient seen. 32 year old male, presented voluntarily for worsening depression, reported significant stressors mainly financial, marital, legal.  Reports cannabis abuse and less regular cocaine use .  Today presents with improved mood and currently denies feeling depressed, does not endorse any major neurovegetative symptoms of depression, does endorse some anxiety, worry. He is prescribed Zoloft, as per chart notes declined at this morning, states "I am not feeling depressed".  We reviewed role of SSRI for anxiety management as well. Also reviewed Neurontin as a possible option to address anxiety and pain issues( reports back pain) . Limited milieu/group participation, but becoming more visible on unit this afternoon.  No agitated or overtly disruptive behaviors. Denies suicidal ideations. Expresses hope to be discharged soon.   Currently principal Problem: Substance induced mood disorder ([place]) Diagnosis:   Patient Active Problem List   Diagnosis Date Noted  . Substance induced mood disorder ([place]) [F19.94] 03/29/2018   Total Time spent with patient: 20 minutes  Past Psychiatric History:   Past Medical History: History reviewed. No pertinent past medical history. History reviewed. No pertinent surgical history. Family History: History reviewed. No pertinent family history. Family Psychiatric  History:  Social History:  Social History   Substance and Sexual Activity  Alcohol Use Yes  . Alcohol/week: 2.4 - 3.0 oz  . Types: 4 - 5 Cans of beer per  week   Comment: occasionally     Social History   Substance and Sexual Activity  Drug Use Yes  . Types: Marijuana, Cocaine   Comment: pt reoprts THC daily    Social History   Socioeconomic History  . Marital status: Married    Spouse name: Not on file  . Number of children: Not on file  . Years of education: Not on file  . Highest education level: Not on file  Occupational History  . Not on file  Social Needs  . Financial resource strain: Not on file  . Food insecurity:    Worry: Not on file    Inability: Not on file  . Transportation needs:    Medical: Not on file    Non-medical: Not on file  Tobacco Use  . Smoking status: Smoker, Current Status Unknown    Types: Cigars  . Smokeless tobacco: Never Used  Substance and Sexual Activity  . Alcohol use: Yes    Alcohol/week: 2.4 - 3.0 oz    Types: 4 - 5 Cans of beer per week    Comment: occasionally  . Drug use: Yes    Types: Marijuana, Cocaine    Comment: pt reoprts THC daily  . Sexual activity: Yes    Birth control/protection: None    Comment: occ  Lifestyle  . Physical activity:    Days per week: Not on file    Minutes per session: Not on file  . Stress: Not on file  Relationships  . Social connections:    Talks on phone: Not on file    Gets together: Not on file    Attends religious service: Not on file    Active member of club or  organization: Not on file    Attends meetings of clubs or organizations: Not on file    Relationship status: Not on file  Other Topics Concern  . Not on file  Social History Narrative  . Not on file   Additional Social History:   Sleep: Fair  Appetite:  Improving  Current Medications: Current Facility-Administered Medications  Medication Dose Route Frequency Provider Last Rate Last Dose  . alum & mag hydroxide-simeth (MAALOX/MYLANTA) 200-200-20 MG/5ML suspension 30 mL  30 mL Oral Q4H PRN Patriciaann Clan E, PA-C      . hydrOXYzine (ATARAX/VISTARIL) tablet 25 mg  25 mg Oral  Q6H PRN Patriciaann Clan E, PA-C   25 mg at 03/30/18 2304  . loperamide (IMODIUM) capsule 2-4 mg  2-4 mg Oral PRN Laverle Hobby, PA-C      . magnesium hydroxide (MILK OF MAGNESIA) suspension 30 mL  30 mL Oral Daily PRN Laverle Hobby, PA-C      . methocarbamol (ROBAXIN) tablet 500 mg  500 mg Oral Q8H PRN Laverle Hobby, PA-C      . ondansetron (ZOFRAN-ODT) disintegrating tablet 4 mg  4 mg Oral Q6H PRN Laverle Hobby, PA-C      . sertraline (ZOLOFT) tablet 50 mg  50 mg Oral Daily Renelda Kilian, Myer Peer, MD      . traZODone (DESYREL) tablet 50 mg  50 mg Oral QHS PRN Dorethea Strubel, Myer Peer, MD        Lab Results:  Results for orders placed or performed during the hospital encounter of 03/29/18 (from the past 48 hour(s))  Comprehensive metabolic panel     Status: None   Collection Time: 03/29/18  6:29 PM  Result Value Ref Range   Sodium 142 135 - 145 mmol/L   Potassium 3.9 3.5 - 5.1 mmol/L   Chloride 107 101 - 111 mmol/L   CO2 26 22 - 32 mmol/L   Glucose, Bld 83 65 - 99 mg/dL   BUN 14 6 - 20 mg/dL   Creatinine, Ser 1.20 0.61 - 1.24 mg/dL   Calcium 9.1 8.9 - 10.3 mg/dL   Total Protein 6.8 6.5 - 8.1 g/dL   Albumin 4.0 3.5 - 5.0 g/dL   AST 33 15 - 41 U/L   ALT 24 17 - 63 U/L   Alkaline Phosphatase 55 38 - 126 U/L   Total Bilirubin 0.6 0.3 - 1.2 mg/dL   GFR calc non Af Amer >60 >60 mL/min   GFR calc Af Amer >60 >60 mL/min    Comment: (NOTE) The eGFR has been calculated using the CKD EPI equation. This calculation has not been validated in all clinical situations. eGFR's persistently <60 mL/min signify possible Chronic Kidney Disease.    Anion gap 9 5 - 15    Comment: Performed at [facility], [place] [address] [place], [place] [address]  Ethanol     Status: None   Collection Time: 03/29/18  6:29 PM  Result Value Ref Range   Alcohol, Ethyl (B) <10 <10 mg/dL    Comment: (NOTE) Lowest detectable limit for serum alcohol is 10 mg/dL. For medical purposes only. Performed  at [facility], [place] [address] [place],  [address]   Salicylate level     Status: None   Collection Time: 03/29/18  6:29 PM  Result Value Ref Range   Salicylate Lvl <1.9 2.8 - 30.0 mg/dL    Comment: Performed at [facility], [place] [address] [place],  [place] [address]  Acetaminophen level     Status: Abnormal   Collection Time: 03/29/18  6:29 PM  Result Value Ref Range   Acetaminophen (Tylenol), Serum <10 (L) 10 - 30 ug/mL    Comment: (NOTE) Therapeutic concentrations vary significantly. A range of 10-30 ug/mL  may be an effective concentration for many patients. However, some  are best treated at concentrations outside of this range. Acetaminophen concentrations >150 ug/mL at 4 hours after ingestion  and >50 ug/mL at 12 hours after ingestion are often associated with  toxic reactions. Performed at [facility], [place] [address], [place], [place] [address]   cbc     Status: None   Collection Time: 03/29/18  6:29 PM  Result Value Ref Range   WBC 6.0 4.0 - 10.5 K/uL   RBC 5.03 4.22 - 5.81 MIL/uL   Hemoglobin 15.4 13.0 - 17.0 g/dL   HCT 44.7 39.0 - 52.0 %   MCV 88.9 78.0 - 100.0 fL   MCH 30.6 26.0 - 34.0 pg   MCHC 34.5 30.0 - 36.0 g/dL   RDW 12.7 11.5 - 15.5 %   Platelets 312 150 - 400 K/uL    Comment: Performed at [facility], [place] [address], [place], [place] [address]  Rapid urine drug screen (hospital performed)     Status: Abnormal   Collection Time: 03/29/18  6:50 PM  Result Value Ref Range   Opiates NONE DETECTED NONE DETECTED   Cocaine POSITIVE (A) NONE DETECTED   Benzodiazepines NONE DETECTED NONE DETECTED   Amphetamines NONE DETECTED NONE DETECTED   Tetrahydrocannabinol POSITIVE (A) NONE DETECTED   Barbiturates (A) NONE DETECTED    Result not available. Reagent lot number recalled by manufacturer.    Comment: (NOTE) DRUG SCREEN FOR MEDICAL PURPOSES ONLY.  IF CONFIRMATION IS  NEEDED FOR ANY PURPOSE, NOTIFY LAB WITHIN 5 DAYS. LOWEST DETECTABLE LIMITS FOR URINE DRUG SCREEN Drug Class                     Cutoff (ng/mL) Amphetamine and metabolites    1000 Barbiturate and metabolites    200 Benzodiazepine                 751 Tricyclics and metabolites     300 Opiates and metabolites        300 Cocaine and metabolites        300 THC                            50 Performed at [facility], [place] [address], [place], [place] [address]     Blood Alcohol level:  Lab Results  Component Value Date   ETH <10 27/78/2423    Metabolic Disorder Labs: No results found for: HGBA1C, MPG No results found for: PROLACTIN No results found for: CHOL, TRIG, HDL, CHOLHDL, VLDL, LDLCALC  Physical Findings: AIMS: Facial and Oral Movements Muscles of Facial Expression: None, normal Lips and Perioral Area: None, normal Jaw: None, normal Tongue: None, normal,Extremity Movements Upper (arms, wrists, hands, fingers): None, normal Lower (legs, knees, ankles, toes): None, normal, Trunk Movements Neck, shoulders, hips: None, normal, Overall Severity Severity of abnormal movements (highest score from questions above): None, normal Incapacitation due to abnormal movements: None, normal Patient's awareness of abnormal movements (rate only patient's report): No Awareness, Dental Status Current problems with teeth and/or dentures?: No Does patient usually wear dentures?: No  CIWA:  CIWA-Ar Total: 1 COWS:  COWS Total Score: 1  Musculoskeletal: Strength & Muscle Tone: within normal limits Gait & Station: normal Patient leans: N/A  Psychiatric Specialty Exam: Physical Exam  ROS no chest pain, no shortness of breath, no vomiting, no fever  Blood pressure 122/73, pulse (!) 55, temperature 98.1 F (36.7 C), temperature source Oral, resp. rate 16, height '5\' 9"'  (1.753 m), weight 81.6 kg (180 lb), SpO2 99 %.Body mass index is 26.58 kg/m.  General Appearance: Fairly  Groomed  Eye Contact:  Good  Speech:  Normal Rate  Volume:  Normal  Mood:  Reports improving mood and currently presents euthymic  Affect:  vaguely anxious, more reactive and fuller in range at this time  Thought Process:  Linear and Descriptions of Associations: Intact  Orientation:  Full (Time, Place, and Person)  Thought Content:  No hallucinations, no delusions expressed, not internally preoccupied  Suicidal Thoughts:  No-currently denies suicidal ideations, no homicidal ideations, contracts for safety on unit  Homicidal Thoughts:  No  Memory:  Recent and remote grossly intact  Judgement:  Fair  Insight:  Fair  Psychomotor Activity:  Normal  Concentration:  Concentration: Good and Attention Span: Good  Recall:  Good  Fund of Knowledge:  Good  Language:  Good  Akathisia:  Negative  Handed:  Right  AIMS (if indicated):     Assets:  Communication Skills Desire for Improvement Resilience  ADL's:  Intact  Cognition:  WNL  Sleep:  Number of Hours: 4.5   Assessment -patient reports improved mood and currently minimizes depression.  Affect presents fuller in range.  Endorses anxiety, partly related to current psychosocial stressors.  Denies suicidal ideations.  Focused on being discharged soon.     Treatment Plan Summary: Daily contact with patient to assess and evaluate symptoms and progress in treatment, Medication management, Plan Inpatient treatment and Medications as below Encourage group and milieu participation to work on coping skills and symptom reduction Encourage efforts to work on sobriety and relapse prevention. Continue Zoloft 50 mg daily for mood and anxiety symptoms Start Neurontin 100 mgrs BID for anxiety, pain Continue Trazodone 50 mgrs QHS PRN for insomnia  Continue Hydroxyzine 25 mgr Q 6 hours PRN for anxiety Treatment team working on disposition planning options Jenne Campus, MD 03/31/2018, 5:41 PM

## 2018-03-31 NOTE — Progress Notes (Signed)
Recreation Therapy Notes  Date: 6.17.19 Time: 0930 Location: [address] Dayroom  Group Topic: Stress Management  Goal Area(s) Addresses:  Patient will verbalize importance of using healthy stress management.  Patient will identify positive emotions associated with healthy stress management.   Intervention: Stress Management  Activity :  Choice Meditation.  LRT played a meditation on choice.  Patients were to follow along as meditation was played to engage in activity.  Education:  Stress Management, Discharge Planning.   Education Outcome: Acknowledges edcuation/In group clarification offered/Needs additional education  Clinical Observations/Feedback: Pt did not attend group.    Karsten Howry, LRT/CTRS         Kamryn Gauthier A 03/31/2018 11:56 AM 

## 2018-03-31 NOTE — Progress Notes (Signed)
D:  Patient denied SI and HI today.  Denied A/V hallucinations.  Contracts for safety.   A:  Patient refused zoloft 50 mg this morning.  Patient stated he only takes night medications.Emotional support and encouragement given patient.  A:  Safety maintained with 15 minute checks.

## 2018-03-31 NOTE — Tx Team (Signed)
Interdisciplinary Treatment and Diagnostic Plan Update  03/31/2018 Time of Session:  0830AM Jeffery Watts MRN: [id number]  Principal Diagnosis: Substance induced mood disorder (HCC)  Secondary Diagnoses: Principal Problem:   Substance induced mood disorder (HCC)   Current Medications:  Current Facility-Administered Medications  Medication Dose Route Frequency Provider Last Rate Last Dose  . alum & mag hydroxide-simeth (MAALOX/MYLANTA) 200-200-20 MG/5ML suspension 30 mL  30 mL Oral Q4H PRN Donell Sievert E, PA-C      . hydrOXYzine (ATARAX/VISTARIL) tablet 25 mg  25 mg Oral Q6H PRN Donell Sievert E, PA-C   25 mg at 03/30/18 2304  . loperamide (IMODIUM) capsule 2-4 mg  2-4 mg Oral PRN Donell Sievert E, PA-C      . magnesium hydroxide (MILK OF MAGNESIA) suspension 30 mL  30 mL Oral Daily PRN Kerry Hough, PA-C      . methocarbamol (ROBAXIN) tablet 500 mg  500 mg Oral Q8H PRN Kerry Hough, PA-C      . ondansetron (ZOFRAN-ODT) disintegrating tablet 4 mg  4 mg Oral Q6H PRN Kerry Hough, PA-C      . sertraline (ZOLOFT) tablet 50 mg  50 mg Oral Daily Cobos, Rockey Situ, MD      . traZODone (DESYREL) tablet 50 mg  50 mg Oral QHS PRN Cobos, Rockey Situ, MD       PTA Medications: Medications Prior to Admission  Medication Sig Dispense Refill Last Dose  . calamine lotion Apply 1 application topically as needed for itching. (Patient not taking: Reported on 03/29/2018) 120 mL 0 Unknown at Unknown time  . Dextromethorphan-Benzocaine (CEPACOL SORE THROAT & COUGH) 5-7.5 MG LOZG Use as directed 1 lozenge in the mouth or throat every 8 (eight) hours as needed (sore throat). (Patient not taking: Reported on 04/25/2017) 16 each 0 Unknown at Unknown time  . guaiFENesin (ROBITUSSIN) 100 MG/5ML liquid Take 5-10 mLs (100-200 mg total) by mouth every 4 (four) hours as needed for cough. (Patient not taking: Reported on 04/25/2017) 60 mL 0 Unknown at Unknown time  . hydrocortisone 2.5 % lotion Apply  topically 2 (two) times daily. (Patient not taking: Reported on 03/29/2018) 59 mL 0 Unknown at Unknown time  . Multiple Vitamins-Minerals (ECHINACEA ACZ PO) Take 1 tablet by mouth daily.   Unknown at Unknown time    Patient Stressors: Financial difficulties Legal issue Substance abuse  Patient Strengths: Ability for insight Active sense of humor Average or above average intelligence Motivation for treatment/growth Work skills  Treatment Modalities: Medication Management, Group therapy, Case management,  1 to 1 session with clinician, Psychoeducation, Recreational therapy.   Physician Treatment Plan for Primary Diagnosis: Substance induced mood disorder (HCC) Long Term Goal(s): Improvement in symptoms so as ready for discharge Improvement in symptoms so as ready for discharge   Short Term Goals: Ability to identify and develop effective coping behaviors will improve Ability to maintain clinical measurements within normal limits will improve Compliance with prescribed medications will improve Ability to identify triggers associated with substance abuse/mental health issues will improve Ability to identify changes in lifestyle to reduce recurrence of condition will improve Ability to verbalize feelings will improve Ability to disclose and discuss suicidal ideas Ability to demonstrate self-control will improve  Medication Management: Evaluate patient's response, side effects, and tolerance of medication regimen.  Therapeutic Interventions: 1 to 1 sessions, Unit Group sessions and Medication administration.  Evaluation of Outcomes: Progressing  Physician Treatment Plan for Secondary Diagnosis: Principal Problem:   Substance induced mood disorder (  HCC)  Long Term Goal(s): Improvement in symptoms so as ready for discharge Improvement in symptoms so as ready for discharge   Short Term Goals: Ability to identify and develop effective coping behaviors will improve Ability to maintain  clinical measurements within normal limits will improve Compliance with prescribed medications will improve Ability to identify triggers associated with substance abuse/mental health issues will improve Ability to identify changes in lifestyle to reduce recurrence of condition will improve Ability to verbalize feelings will improve Ability to disclose and discuss suicidal ideas Ability to demonstrate self-control will improve     Medication Management: Evaluate patient's response, side effects, and tolerance of medication regimen.  Therapeutic Interventions: 1 to 1 sessions, Unit Group sessions and Medication administration.  Evaluation of Outcomes: Progressing   RN Treatment Plan for Primary Diagnosis: Substance induced mood disorder (HCC) Long Term Goal(s): Knowledge of disease and therapeutic regimen to maintain health will improve  Short Term Goals: Ability to remain free from injury will improve, Ability to verbalize frustration and anger appropriately will improve and Ability to identify and develop effective coping behaviors will improve  Medication Management: RN will administer medications as ordered by provider, will assess and evaluate patient's response and provide education to patient for prescribed medication. RN will report any adverse and/or side effects to prescribing provider.  Therapeutic Interventions: 1 on 1 counseling sessions, Psychoeducation, Medication administration, Evaluate responses to treatment, Monitor vital signs and CBGs as ordered, Perform/monitor CIWA, COWS, AIMS and Fall Risk screenings as ordered, Perform wound care treatments as ordered.  Evaluation of Outcomes: Progressing   LCSW Treatment Plan for Primary Diagnosis: Substance induced mood disorder (HCC) Long Term Goal(s): Safe transition to appropriate next level of care at discharge, Engage patient in therapeutic group addressing interpersonal concerns.  Short Term Goals: Engage patient in  aftercare planning with referrals and resources, Facilitate patient progression through stages of change regarding substance use diagnoses and concerns and Identify triggers associated with mental health/substance abuse issues  Therapeutic Interventions: Assess for all discharge needs, 1 to 1 time with Social worker, Explore available resources and support systems, Assess for adequacy in community support network, Educate family and significant other(s) on suicide prevention, Complete Psychosocial Assessment, Interpersonal group therapy.  Evaluation of Outcomes: Progressing   Progress in Treatment: Attending groups: Yes. Participating in groups: Yes. Taking medication as prescribed: Yes. Toleration medication: Yes. Family/Significant other contact made: SPE completed with pt; pt declined to consent to collateral contact.  Patient understands diagnosis: Yes. Discussing patient identified problems/goals with staff: Yes. Medical problems stabilized or resolved: Yes. Denies suicidal/homicidal ideation: Yes. Issues/concerns per patient self-inventory: No. Other: n/a  New problem(s) identified: No, Describe:  n/a  New Short Term/Long Term Goal(s): detox, medication management for mood stabilization; elimination of SI thoughts; development of comprehensive mental wellness/sobriety plan.   Patient Goals:  "to learn how to communicate better and find out if I have a mental illness."  Discharge Plan or Barriers: CSW assessing for appropriate referrals. MHAG pamphlet, Mobile Crisis information, and AA/NA information provided to patient for additional community support and resources.   Reason for Continuation of Hospitalization: Anxiety Depression Medication stabilization Suicidal ideation Withdrawal symptoms  Estimated Length of Stay: Wed, 04/02/18  Attendees: Patient: Jeffery Watts 03/31/2018 8:40 AM  Physician: Dr. Altamese Carolinaainville MD; Dr. Jama Flavorsobos MD 03/31/2018 8:40 AM  Nursing: Erskine SquibbJane RN; OcillaBeverly RN  03/31/2018 8:40 AM  RN Care Manager:x 03/31/2018 8:40 AM  Social Worker: Corrie MckusickHeather Charlsey Moragne LCSW 03/31/2018 8:40 AM  Recreational Therapist: x 03/31/2018 8:40  AM  Other: Armandina Stammer NP 03/31/2018 8:40 AM  Other:  03/31/2018 8:40 AM  Other: 03/31/2018 8:40 AM    Scribe for Treatment Team: Rona Ravens, LCSW 03/31/2018 8:40 AM

## 2018-03-31 NOTE — Progress Notes (Signed)
Gabapentin 100 mg given to patient after dinner per MD order.  Patient was given written medication information about gabapentin.  Patient stated "I will take this medicine now to see how it works for me."  Respirations even and unlabored.  No signs/symptoms of pain/distress noted on patient's face/body movements.   Safety maintained with 15 minute checks.  Patient continues to talk to staff about giving his credit card to girlfriend, etc.  Patient has recently gambled away large sum of money, note says thousands of dollars.

## 2018-04-01 MED ORDER — HYDROXYZINE HCL 25 MG PO TABS: 25.0000 mg | ORAL_TABLET | Freq: Four times a day (QID) | ORAL | 0 refills | Status: AC | PRN

## 2018-04-01 MED ORDER — HYDROCORTISONE 2.5 % EX LOTN: TOPICAL_LOTION | Freq: Two times a day (BID) | CUTANEOUS | 0 refills | Status: AC

## 2018-04-01 MED ORDER — CALAMINE EX LOTN: 1.0000 "application " | TOPICAL_LOTION | CUTANEOUS | 0 refills | Status: AC | PRN

## 2018-04-01 NOTE — Progress Notes (Signed)
D:  Jeffery Watts has been up and visible on the unit.  He did attend evening AA group and was noted sitting in the day room not interacting with peers. He denied any SI/HI or A/V hallucinations.  He denied any depressive symptoms and stated that he was doing "fine."  He did finally request something for anxiety at bedtime to help him relax.  He was labile and could get loud at times.   He denied any pain or discomfort and appeared to be in no physical distress.  He reported an "ok" visit this evening with his family.  He made no mention of wanting to get his credit card out of the locker. A:  1:1 with RN for support and encouragement.  Medications as ordered.  Q 15 minute checks maintained for safety.  Encouraged participation in group and unit activities.   R:  Tristyn remains safe on the unit.  We will continue to monitor the progress towards his goals.

## 2018-04-01 NOTE — [facility] Suicide Risk Assessment (Signed)
[facility]   Principal Problem: Substance induced mood disorder [facility](HCC) Discharge Diagnoses:  Patient Active Problem List   Diagnosis Date Noted  . Substance induced mood disorder (HCC) [F19.94] 03/29/2018    Total Time spent with patient: 30 minutes  Musculoskeletal: Strength & Muscle Tone: within normal limits Gait & Station: normal Patient leans: N/A  Psychiatric Specialty Exam: Review of Systems  All other systems reviewed and are negative.   Blood pressure 109/70, pulse (!) 50, temperature 98.2 F (36.8 C), temperature source Oral, resp. rate 16, height 5\' 9"  (1.753 m), weight 81.6 kg (180 lb), SpO2 99 %.Body mass index is 26.58 kg/m.  General Appearance: Casual  Eye Contact::  Good  Speech:  Clear and Coherent409  Volume:  Normal  Mood:  Euthymic  Affect:  Congruent  Thought Process:  Coherent  Orientation:  Full (Time, Place, and Person)  Thought Content:  Logical  Suicidal Thoughts:  No  Homicidal Thoughts:  No  Memory:  Immediate;   Fair Recent;   Fair Remote;   Fair  Judgement:  Good  Insight:  Fair  Psychomotor Activity:  Normal  Concentration:  Good  Recall:  Good  Fund of Knowledge:Fair  Language: Good  Akathisia:  Negative  Handed:  Right  AIMS (if indicated):     Assets:  Communication Skills Desire for Improvement Housing Leisure Time Physical Health Resilience  Sleep:  Number of Hours: 4.75  Cognition: WNL  ADL's:  Intact   Mental Status Per Nursing Assessment::   On Admission:  Suicidal ideation indicated by patient, Suicide plan  Demographic Factors:  Male, Low socioeconomic status and Unemployed  Loss Factors: NA  Historical Factors: Impulsivity  Risk Reduction Factors:   Positive social support  Continued Clinical Symptoms:  Alcohol/Substance Abuse/Dependencies  Cognitive Features That Contribute To Risk:  None    Suicide Risk:  Minimal: No identifiable suicidal ideation.  Patients presenting  with no risk factors but with morbid ruminations; may be classified as minimal risk based on the severity of the depressive symptoms  Follow-up Information    [facility]. Go on 04/02/2018.   Specialty:  Behavioral Health Why:  Appointment is 04/02/18 at 8:15am.  Contact information: [address] [place] [address] [phone]           Plan Of Care/Follow-up recommendations:  Activity:  ad lib. Diet:  Regular  Antonieta PertGreg Lawson Anavey Coombes, MD 04/01/2018, 12:59 PM

## 2018-04-01 NOTE — Plan of Care (Signed)
[place] did attend evening AA group after not attending any groups today.

## 2018-04-01 NOTE — Progress Notes (Signed)
Discharge note:  Patient discharged home per MD order.  Patient received all personal belongings from locker and unit.  Reviewed AVS/transition record with patient and he indicated understanding.  He denies any thoughts of self harm.  Patient received a prescription for vistaril and will follow up with [facility].  Patient left ambulatory with 2 bus passes.

## 2018-04-01 NOTE — Progress Notes (Signed)
Patient has refused all medications this morning.  Patient denied SI and HI, contracts for safety.  Denied A/V hallucinations.  Respirations even and unlabored.  No signs/symptoms of pain/distress noted on patient's face/body movements.  Safety maintained with 15 minute checks. Patient has stayed in bed all morning.  Patient did go to dining room for lunch.

## 2018-04-01 NOTE — Discharge Summary (Addendum)
Physician Discharge Summary Note  Patient:  Jeffery Watts is an 32 y.o., male MRN:  [id number] DOB:  1986/03/14 Patient phone:  [phone] (home)  Patient address:   [address] [place] [address],  Total Time spent with patient: Greater than 30 minutes  Date of Admission:  03/29/2018  Date of Discharge: 04-01-18  Reason for Admission: Worsening stress & symptoms of depression.  Principal Problem: Substance induced mood disorder [facility])  Discharge Diagnoses: Patient Active Problem List   Diagnosis Date Noted  . Substance induced mood disorder [facility]) [F19.94] 03/29/2018   Past Psychiatric History: Substance induced mood disorder  Past Medical History: History reviewed. No pertinent past medical history. History reviewed. No pertinent surgical history.  Family History: History reviewed. No pertinent family history.  \Family Psychiatric  History: See H&P  Social History:  Social History   Substance and Sexual Activity  Alcohol Use Yes  . Alcohol/week: 2.4 - 3.0 oz  . Types: 4 - 5 Cans of beer per week   Comment: occasionally     Social History   Substance and Sexual Activity  Drug Use Yes  . Types: Marijuana, Cocaine   Comment: pt reoprts THC daily    Social History   Socioeconomic History  . Marital status: Married    Spouse name: Not on file  . Number of children: Not on file  . Years of education: Not on file  . Highest education level: Not on file  Occupational History  . Not on file  Social Needs  . Financial resource strain: Not on file  . Food insecurity:    Worry: Not on file    Inability: Not on file  . Transportation needs:    Medical: Not on file    Non-medical: Not on file  Tobacco Use  . Smoking status: Smoker, Current Status Unknown    Types: Cigars  . Smokeless tobacco: Never Used  Substance and Sexual Activity  . Alcohol use: Yes    Alcohol/week: 2.4 - 3.0 oz    Types: 4 - 5 Cans of beer per week    Comment: occasionally  .  Drug use: Yes    Types: Marijuana, Cocaine    Comment: pt reoprts THC daily  . Sexual activity: Yes    Birth control/protection: None    Comment: occ  Lifestyle  . Physical activity:    Days per week: Not on file    Minutes per session: Not on file  . Stress: Not on file  Relationships  . Social connections:    Talks on phone: Not on file    Gets together: Not on file    Attends religious service: Not on file    Active member of club or organization: Not on file    Attends meetings of clubs or organizations: Not on file    Relationship status: Not on file  Other Topics Concern  . Not on file  Social History Narrative  . Not on file   Hospital Course: (Per admission assessment): Jeffery Watts an 32 y.o.malewho presents to the ED voluntarily.Pt reports he has been increasingly depressed and overwhelmed with stress for the past 2 years. Pt states things got worse today and he began having thoughts of suicide. Pt states this scared him because he has never had any psych hx, however he was thinking about different ways to kill himself. Pt states he experienced racing thoughts including "you should just kill yourself, you will be a coward, what about your  children, what will they think?" Pt states this scared him so he decided to come to the ED for help.  After the above admission assessment, Jeffery Watts was started on the medication regimen for his presenting symptoms. The benefit & adverse effects of these medications were discussed & reviewed with him. The medications include; Gabapentin 100 mg for agitation, Vistaril 25 mg prn for anxiety, Sertraline 50 mg for depression & Trazodone 50 mg for insomnia. He was also enrolled & participated in the group counseling sessions being offered & held on this unit. He learned coping skills that should help him after discharge to cope better.  During the course of his hospitalization, Jeffery Watts was adamant about not wanting to be on any medications  because he stated that he was not really depressed, rather more anxious than anything else. He thinks he could function well without the depression & other medicines. He is optimistic about the future without depression medicines. He has decided to go home with just the Vistaril for anxiety as he believed, that was his major issues. He denies any psychotic features. No craving for substances. No severe anxiety symptoms. The features of depression has markedly improved since coming off the substances. No thoughts of violence. No access to weapons. No new stressors.   Jeffery Watts's case was presented during treatment team meeting this morning. The nursing staff reports that patient has been appropriate on the unit. Patient has been interacting well with peers. No behavioral issues. Patient has not voiced any suicidal thoughts. Patient has not been observed to be internally stimulated or preoccupied. The team members feel that patient is back to his baseline level of function. The team agrees with plan to discharge patient today to continue mental health care on an outpatient basis as noted below.  He is provided with all the necessary information needed to make this appointment without any problems. He left [facility] with all personal belongings in no apparent distress. [facility] assisted with transportation fare for the city bus.  Physical Findings: AIMS: Facial and Oral Movements Muscles of Facial Expression: None, normal Lips and Perioral Area: None, normal Jaw: None, normal Tongue: None, normal,Extremity Movements Upper (arms, wrists, hands, fingers): None, normal Lower (legs, knees, ankles, toes): None, normal, Trunk Movements Neck, shoulders, hips: None, normal, Overall Severity Severity of abnormal movements (highest score from questions above): None, normal Incapacitation due to abnormal movements: None, normal Patient's awareness of abnormal movements (rate only patient's report): No Awareness, Dental  Status Current problems with teeth and/or dentures?: No Does patient usually wear dentures?: No  CIWA:  CIWA-Ar Total: 1 COWS:  COWS Total Score: 1  Musculoskeletal: Strength & Muscle Tone: within normal limits Gait & Station: normal Patient leans: N/A  Psychiatric Specialty Exam: Physical Exam  Constitutional: He appears well-developed.  HENT:  Head: Normocephalic.  Eyes: Pupils are equal, round, and reactive to light.  Neck: Normal range of motion.  Cardiovascular: Normal rate.  Respiratory: Effort normal.  GI: Soft.  Genitourinary:  Genitourinary Comments: Deferred  Musculoskeletal: Normal range of motion.  Neurological: He is alert.  Skin: Skin is warm.    Review of Systems  Constitutional: Negative.   HENT: Negative.   Eyes: Negative.   Respiratory: Negative.   Cardiovascular: Negative.   Gastrointestinal: Negative.   Genitourinary: Negative.   Musculoskeletal: Negative.   Skin: Negative.   Neurological: Negative.   Endo/Heme/Allergies: Negative.   Psychiatric/Behavioral: Positive for depression (Stabilized with medication prior to discharge) and substance abuse (Hx. Cocaine & THC). Negative for  hallucinations, memory loss and suicidal ideas. The patient has insomnia (Stabilized with medication prior to discharge). The patient is not nervous/anxious.     Blood pressure 133/85, pulse 60, temperature 98.2 F (36.8 C), temperature source Oral, resp. rate 16, height 5\' 9"  (1.753 m), weight 81.6 kg (180 lb), SpO2 99 %.Body mass index is 26.58 kg/m.  See Md's SRA   Have you used any form of tobacco in the last 30 days? (Cigarettes, Smokeless Tobacco, Cigars, and/or Pipes): Yes  Has this patient used any form of tobacco in the last 30 days? (Cigarettes, Smokeless Tobacco, Cigars, and/or Pipes): N/A  Blood Alcohol level:  Lab Results  Component Value Date   ETH <10 03/29/2018   Metabolic Disorder Labs:  No results found for: HGBA1C, MPG No results found for:  PROLACTIN No results found for: CHOL, TRIG, HDL, CHOLHDL, VLDL, LDLCALC  See Psychiatric Specialty Exam and Suicide Risk Assessment completed by Attending Physician prior to discharge.  Discharge destination:  Home  Is patient on multiple antipsychotic therapies at discharge:  No   Has Patient had three or more failed trials of antipsychotic monotherapy by history:  No  Recommended Plan for Multiple Antipsychotic Therapies: NA  Allergies as of 04/01/2018      Reactions   Tylenol [acetaminophen] Itching      Medication List    STOP taking these medications   Dextromethorphan-Benzocaine 5-7.5 MG Lozg Commonly known as:  CEPACOL SORE THROAT & COUGH   ECHINACEA ACZ PO   guaiFENesin 100 MG/5ML liquid Commonly known as:  ROBITUSSIN     TAKE these medications     Indication  calamine lotion Apply 1 application topically as needed for itching.  Indication:  Itching   hydrocortisone 2.5 % lotion Apply topically 2 (two) times daily. For itching What changed:  additional instructions  Indication:  Itching   hydrOXYzine 25 MG tablet Commonly known as:  ATARAX/VISTARIL Take 1 tablet (25 mg total) by mouth every 6 (six) hours as needed for anxiety.  Indication:  Feeling Anxious      Follow-up Information    [facility]. Go on 04/02/2018.   Specialty:  Behavioral Health Why:  Appointment is 04/02/18 at 8:15am.  Contact information: [address] [place] [address] [phone]          Follow-up recommendations:  Activity:  As tolerated Diet: As recommended by your primary care doctor. Keep all scheduled follow-up appointments as recommended.  Comments: Patient is instructed prior to discharge to: Take all medications as prescribed by his/her mental healthcare provider. Report any adverse effects and or reactions from the medicines to his/her outpatient provider promptly. Patient has been instructed & cautioned: To not engage in alcohol and or illegal drug use while  on prescription medicines. In the event of worsening symptoms, patient is instructed to call the crisis hotline, 911 and or go to the nearest ED for appropriate evaluation and treatment of symptoms. To follow-up with his/her primary care provider for your other medical issues, concerns and or health care needs.   Signed: Armandina StammerAgnes Hilmer Aliberti, NP, PMHNP, FNP-BC 04/01/2018, 1:33 PM

## 2018-04-24 ENCOUNTER — Emergency Department (HOSPITAL_COMMUNITY)
Admission: EM | Admit: 2018-04-24 | Discharge: 2018-04-24 | Disposition: A | Discharge status: Home / Self Care | Payer: Self-pay | Attending: Emergency Medicine | Admitting: Emergency Medicine

## 2018-04-24 ENCOUNTER — Encounter (HOSPITAL_COMMUNITY): Payer: Self-pay

## 2018-04-24 ENCOUNTER — Other Ambulatory Visit: Payer: Self-pay

## 2018-04-24 ENCOUNTER — Emergency Department (HOSPITAL_COMMUNITY): Payer: Self-pay

## 2018-04-24 DIAGNOSIS — L089 Local infection of the skin and subcutaneous tissue, unspecified: Secondary | ICD-10-CM

## 2018-04-24 DIAGNOSIS — W540XXA Bitten by dog, initial encounter: Secondary | ICD-10-CM | POA: Insufficient documentation

## 2018-04-24 DIAGNOSIS — Y939 Activity, unspecified: Secondary | ICD-10-CM | POA: Insufficient documentation

## 2018-04-24 DIAGNOSIS — Y999 Unspecified external cause status: Secondary | ICD-10-CM | POA: Insufficient documentation

## 2018-04-24 DIAGNOSIS — Z79899 Other long term (current) drug therapy: Secondary | ICD-10-CM | POA: Insufficient documentation

## 2018-04-24 DIAGNOSIS — Y929 Unspecified place or not applicable: Secondary | ICD-10-CM | POA: Insufficient documentation

## 2018-04-24 DIAGNOSIS — S61451A Open bite of right hand, initial encounter: Secondary | ICD-10-CM | POA: Insufficient documentation

## 2018-04-24 MED ORDER — AMOXICILLIN-POT CLAVULANATE 875-125 MG PO TABS: 1.0000 | ORAL_TABLET | Freq: Two times a day (BID) | ORAL | 0 refills | Status: AC

## 2018-04-24 MED ORDER — IBUPROFEN 600 MG PO TABS: 600.0000 mg | ORAL_TABLET | Freq: Four times a day (QID) | ORAL | 0 refills | Status: AC | PRN

## 2018-04-24 NOTE — ED Triage Notes (Signed)
Pt endorses trying to hit a dog yesterday and his right got injured when it hit the teeth of the dog. Unable to make fish. CMS intact.

## 2018-04-24 NOTE — ED Triage Notes (Signed)
PT reports the dog is owned by someone he knows. Pt reports the dog is up to date on Rabies shots.

## 2018-04-24 NOTE — ED Provider Notes (Addendum)
[facility] EMERGENCY DEPARTMENT Provider Note   CSN: [id number] Arrival date & time: 04/24/18  1103     History   Chief Complaint Chief Complaint  Patient presents with  . Hand Injury    HPI Jeffery Watts is a 32 y.o. male.  The history is provided by the patient. No language interpreter was used.     32 year old male with history of polysubstance abuse presenting for evaluation of R hand injury.  Patient report he was hanging out with his friends house yesterday. Both were aggressively playing with their dogs when he injured his R hand while trying to punch the dog.  He report the dog was being aggressive at him initially and he was just playfully hitting it, but struck his R hand against the teeth of the dog causing injury.  He suffered a puncture wound to the third MCP upon making contact with the dog's teeth.  Since then he noticed increasing pain in which he describes a sharp throbbing sensation, moderate in severity with associated swelling to the affected area and difficulty bending his fingers.  He is right-hand dominant.  Patient is up-to-date with tetanus.  Dog is up-to-date with immunization.  History reviewed. No pertinent past medical history.  Patient Active Problem List   Diagnosis Date Noted  . Substance induced mood disorder (HCC) 03/29/2018    History reviewed. No pertinent surgical history.      Home Medications    Prior to Admission medications   Medication Sig Start Date End Date Taking? Authorizing Provider  calamine lotion Apply 1 application topically as needed for itching. 04/01/18   Armandina StammerNwoko, Agnes I, NP  hydrocortisone 2.5 % lotion Apply topically 2 (two) times daily. For itching 04/01/18   Armandina StammerNwoko, Agnes I, NP  hydrOXYzine (ATARAX/VISTARIL) 25 MG tablet Take 1 tablet (25 mg total) by mouth every 6 (six) hours as needed for anxiety. 04/01/18   Sanjuana KavaNwoko, Agnes I, NP    Family History History reviewed. No pertinent family  history.  Social History Social History   Tobacco Use  . Smoking status: Smoker, Current Status Unknown    Types: Cigars  . Smokeless tobacco: Never Used  Substance Use Topics  . Alcohol use: Yes    Alcohol/week: 2.4 - 3.0 oz    Types: 4 - 5 Cans of beer per week    Comment: occasionally  . Drug use: Yes    Types: Marijuana, Cocaine    Comment: pt reoprts THC daily     Allergies   Tylenol [acetaminophen]   Review of Systems Review of Systems  Constitutional: Negative for fever.  Musculoskeletal: Positive for joint swelling.  Skin: Positive for wound.  Neurological: Negative for numbness.     Physical Exam Updated Vital Signs BP (!) 141/77 (BP Location: Right Arm)   Pulse 78   Temp 98.4 F (36.9 C) (Oral)   Resp 16   Ht 5\' 9"  (1.753 m)   Wt 81.6 kg (180 lb)   SpO2 100%   BMI 26.58 kg/m   Physical Exam  Constitutional: He appears well-developed and well-nourished. No distress.  HENT:  Head: Atraumatic.  Eyes: Conjunctivae are normal.  Neck: Neck supple.  Musculoskeletal: He exhibits tenderness (Right hand: Point tenderness to third MCP with a 1 mm puncture wound with surrounding edema and erythema but no streaking.  Difficulty making a fist.  Brisk cap refill distally.).  Neurological: He is alert.  Skin: No rash noted.  Psychiatric: He has a normal mood  and affect.  Nursing note and vitals reviewed.    ED Treatments / Results  Labs (all labs ordered are listed, but only abnormal results are displayed) Labs Reviewed - No data to display  EKG None  Radiology No results found.  Procedures Procedures (including critical care time)  Medications Ordered in ED Medications - No data to display   Initial Impression / Assessment and Plan / ED Course  I have reviewed the triage vital signs and the nursing notes.  Pertinent labs & imaging results that were available during my care of the patient were reviewed by me and considered in my medical  decision making (see chart for details).     BP (!) 141/77 (BP Location: Right Arm)   Pulse 78   Temp 98.4 F (36.9 C) (Oral)   Resp 16   Ht 5\' 9"  (1.753 m)   Wt 81.6 kg (180 lb)   SpO2 100%   BMI 26.58 kg/m    Final Clinical Impressions(s) / ED Diagnoses   Final diagnoses:  Dog bite of right hand with infection, initial encounter    ED Discharge Orders        Ordered    ibuprofen (ADVIL,MOTRIN) 600 MG tablet  Every 6 hours PRN     04/24/18 1215    amoxicillin-clavulanate (AUGMENTIN) 875-125 MG tablet  2 times daily     04/24/18 1215     11:56 AM Patient was punching a dog, and suffered a puncture wound to the dorsum of his right dominant hand overlying the third MCP.  He is up-to-date with immunizations as well as the dog.  He has had difficulty making a fist secondary to pain.  There is no lymphangitis presence and no abscess.  X-ray without acute fractures or dislocation but it did note swelling to the MCP.  Patient would benefit from anti-inflammatory medication as well as Augmentin.  He will need to follow-up closely with hand specialist for further management.  12:16 PM Hand specialist Dr. Merlyn Lot have been paged for further consultation and outpt f/u.   12:43 PM Pt left to pick up his wife.  I have consulted Dr. Merlyn Lot who recommend contacting pt to return to [facility] for surgical intervention as he has potential joint infection.  I called and notify pt.  Pt will return for further care.  Pt made aware to stay NPO.   Fayrene Helper, PA-C 04/24/18 1218    Fayrene Helper, PA-C 04/24/18 1244    Wynetta Fines, MD 04/25/18 0700

## 2018-04-24 NOTE — ED Notes (Signed)
Patient transported to X-ray 

## 2018-04-24 NOTE — Discharge Instructions (Addendum)
Please call and follow up with hand specialist for further management of your injury.  Take antibiotic and pain medication as prescribed. Return if you have any concerns.

## 2018-05-05 ENCOUNTER — Ambulatory Visit: Payer: Self-pay | Admitting: Internal Medicine

## 2018-06-25 ENCOUNTER — Emergency Department (HOSPITAL_COMMUNITY)
Admission: EM | Admit: 2018-06-25 | Discharge: 2018-06-25 | Disposition: A | Discharge status: Home / Self Care | Payer: Self-pay | Attending: Emergency Medicine | Admitting: Emergency Medicine

## 2018-06-25 ENCOUNTER — Encounter (HOSPITAL_COMMUNITY): Payer: Self-pay | Admitting: Obstetrics and Gynecology

## 2018-06-25 ENCOUNTER — Other Ambulatory Visit: Payer: Self-pay

## 2018-06-25 ENCOUNTER — Emergency Department (HOSPITAL_COMMUNITY): Payer: Self-pay

## 2018-06-25 DIAGNOSIS — R0981 Nasal congestion: Secondary | ICD-10-CM | POA: Insufficient documentation

## 2018-06-25 DIAGNOSIS — B9789 Other viral agents as the cause of diseases classified elsewhere: Secondary | ICD-10-CM

## 2018-06-25 DIAGNOSIS — J069 Acute upper respiratory infection, unspecified: Secondary | ICD-10-CM | POA: Insufficient documentation

## 2018-06-25 DIAGNOSIS — F1290 Cannabis use, unspecified, uncomplicated: Secondary | ICD-10-CM | POA: Insufficient documentation

## 2018-06-25 MED ORDER — BENZONATATE 100 MG PO CAPS: 100.0000 mg | ORAL_CAPSULE | Freq: Three times a day (TID) | ORAL | 0 refills | Status: AC

## 2018-06-25 NOTE — ED Notes (Signed)
Bed: WTR5 Expected date:  Expected time:  Means of arrival:  Comments: 
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
## 2018-06-25 NOTE — ED Triage Notes (Signed)
Pt reports he has had a cough and feels like it hurts when he coughs. Pt reports he is also congested. Pt denies N/V/D and abdominal pain.

## 2018-06-25 NOTE — Discharge Instructions (Signed)
Please purchase an expectorant like Mucinex over the counter to help loosen and thin the mucus production.You may also try some over the counter Robitussin to help with your cough. Continue to hydrate with plenty of fluids and Gatorade.If you experience any shortness of breath, chest pain or fever please return to the ED for reevaluation.

## 2018-07-03 ENCOUNTER — Encounter (HOSPITAL_COMMUNITY): Payer: Self-pay

## 2018-07-03 ENCOUNTER — Emergency Department (HOSPITAL_COMMUNITY)
Admission: EM | Admit: 2018-07-03 | Discharge: 2018-07-03 | Disposition: A | Discharge status: Home / Self Care | Payer: Self-pay | Attending: Emergency Medicine | Admitting: Emergency Medicine

## 2018-07-03 ENCOUNTER — Emergency Department (HOSPITAL_COMMUNITY): Payer: Self-pay

## 2018-07-03 DIAGNOSIS — F17290 Nicotine dependence, other tobacco product, uncomplicated: Secondary | ICD-10-CM | POA: Insufficient documentation

## 2018-07-03 DIAGNOSIS — R0789 Other chest pain: Secondary | ICD-10-CM | POA: Insufficient documentation

## 2018-07-03 DIAGNOSIS — B9789 Other viral agents as the cause of diseases classified elsewhere: Secondary | ICD-10-CM

## 2018-07-03 DIAGNOSIS — J069 Acute upper respiratory infection, unspecified: Secondary | ICD-10-CM | POA: Insufficient documentation

## 2018-07-03 MED ORDER — CYCLOBENZAPRINE HCL 10 MG PO TABS: 10.0000 mg | ORAL_TABLET | Freq: Every evening | ORAL | 0 refills | Status: AC | PRN

## 2018-07-03 NOTE — ED Triage Notes (Signed)
Pt presents with c/o cough and pain on the right side of his chest. Pt reports the pain is with movement and coughing and sneezing.

## 2018-07-03 NOTE — ED Provider Notes (Signed)
[facility] Provider Note   CSN: [id number] Arrival date & time: 07/03/18  1643     History   Chief Complaint Chief Complaint  Patient presents with  . Cough    HPI Jeffery Watts is a 32 y.o. male presenting to the ED with complaint of persistent cough and URI sx. Pt was evaluated on 06/25/18 and diagnosed with viral URI. Neg CXR at the time. He reports today with complaint of persistent nonproductive cough, nasal congestion and left sided rib pain. He states he has pain with coughing and sometimes moving. Has been taking prescribed tessalon and OTC mucinex for sx. Denies sore throat, ear pain, fever, dec appetite.  The history is provided by the patient and medical records.    History reviewed. No pertinent past medical history.  Patient Active Problem List   Diagnosis Date Noted  . Substance induced mood disorder (HCC) 03/29/2018    History reviewed. No pertinent surgical history.      Home Medications    Prior to Admission medications   Medication Sig Start Date End Date Taking? Authorizing Provider  amoxicillin-clavulanate (AUGMENTIN) 875-125 MG tablet Take 1 tablet by mouth 2 (two) times daily. One po bid x 7 days 04/24/18   Fayrene Helperran, Bowie, PA-C  calamine lotion Apply 1 application topically as needed for itching. 04/01/18   Armandina StammerNwoko, Agnes I, NP  cyclobenzaprine (FLEXERIL) 10 MG tablet Take 1 tablet (10 mg total) by mouth at bedtime as needed for muscle spasms. 07/03/18   Robinson, [place] N, PA-C  hydrocortisone 2.5 % lotion Apply topically 2 (two) times daily. For itching 04/01/18   Armandina StammerNwoko, Agnes I, NP  hydrOXYzine (ATARAX/VISTARIL) 25 MG tablet Take 1 tablet (25 mg total) by mouth every 6 (six) hours as needed for anxiety. 04/01/18   Armandina StammerNwoko, Agnes I, NP  ibuprofen (ADVIL,MOTRIN) 600 MG tablet Take 1 tablet (600 mg total) by mouth every 6 (six) hours as needed. 04/24/18   Fayrene Helperran, Bow[place], PA-C    Family History History reviewed. No pertinent  family history.  Social History Social History   Tobacco Use  . Smoking status: Smoker, Current Status Unknown    Types: Cigars  . Smokeless tobacco: Never Used  Substance Use Topics  . Alcohol use: Yes    Alcohol/week: 4.0 - 5.0 standard drinks    Types: 4 - 5 Cans of beer per week    Comment: occasionally  . Drug use: Yes    Types: Marijuana, Cocaine    Comment: pt reoprts THC daily     Allergies   Tylenol [acetaminophen]   Review of Systems Review of Systems  Constitutional: Negative for fever.  HENT: Positive for congestion. Negative for ear pain, sore throat and trouble swallowing.   Respiratory: Positive for cough. Negative for shortness of breath.      Physical Exam Updated Vital Signs BP (!) 156/76 (BP Location: Right Arm)   Pulse 82   Temp 98.6 F (37 C) (Oral)   Resp 16   Ht 5\' 9"  (1.753 m)   Wt 83.9 kg   SpO2 97%   BMI 27.32 kg/m   Physical Exam  Constitutional: He appears well-developed and well-nourished. No distress.  HENT:  Head: Normocephalic and atraumatic.  Right Ear: Tympanic membrane and ear canal normal.  Left Ear: Tympanic membrane and ear canal normal.  Mouth/Throat: Uvula is midline and oropharynx is clear and moist. No trismus in the jaw. No uvula swelling.  Eyes: Conjunctivae are normal.  Neck: Normal  range of motion. Neck supple.  Cardiovascular: Normal rate, regular rhythm and normal heart sounds.  Pulmonary/Chest: Effort normal and breath sounds normal. No stridor. No respiratory distress. He has no wheezes. He has no rales.  Lymphadenopathy:    He has no cervical adenopathy.  Psychiatric: He has a normal mood and affect. His behavior is normal.  Nursing note and vitals reviewed.    ED Treatments / Results  Labs (all labs ordered are listed, but only abnormal results are displayed) Labs Reviewed - No data to display  EKG None  Radiology Dg Chest 2 View  Result Date: 07/03/2018 CLINICAL DATA:  Cough, right-sided  chest pain EXAM: CHEST - 2 VIEW COMPARISON:  06/25/2018 FINDINGS: The heart size and mediastinal contours are within normal limits. Both lungs are clear. The visualized skeletal structures are unremarkable. IMPRESSION: No active cardiopulmonary disease. Electronically Signed   By: Elige Ko   On: 07/03/2018 18:00    Procedures Procedures (including critical care time)  Medications Ordered in ED Medications - No data to display   Initial Impression / Assessment and Plan / ED Course  I have reviewed the triage vital signs and the nursing notes.  Pertinent labs & imaging results that were available during my care of the patient were reviewed by me and considered in my medical decision making (see chart for details).     Patients symptoms are consistent with URI, likely viral etiology. Afebrile, tolerating secretions. Cough is nonproductive.  Lungs clear to auscultation bilaterally. Repeat CXR today to rule out development of PNA; negative for acute infiltrate. Suspect chest wall pain is musculoskeletal in nature. PERC neg.  Discussed that antibiotics are not indicated for viral infections. Pt will be discharged with symptomatic treatment. Verbalizes understanding and is agreeable with plan. Pt is hemodynamically stable & in NAD prior to dc.  Discussed results, findings, treatment and follow up. Patient advised of return precautions. Patient verbalized understanding and agreed with plan.   Final Clinical Impressions(s) / ED Diagnoses   Final diagnoses:  Viral URI with cough  Chest wall pain    ED Discharge Orders         Ordered    cyclobenzaprine (FLEXERIL) 10 MG tablet  At bedtime PRN     07/03/18 1812           Robinson, [place] N, PA-C 07/03/18 Darlys Gales, MD 07/03/18 2336

## 2018-07-03 NOTE — Discharge Instructions (Signed)
Please read instructions below.  You can take tylenol or ibuprofen as needed. You can take flexeril as needed for rib pain. Drink plenty of water.  Use saline nasal spray for congestion. Follow up with your primary care provider as needed if symptoms worsen. Return to the ER for inability to swallow liquids, difficulty breathing, or new or concerning symptoms.

## 2018-07-15 ENCOUNTER — Encounter (HOSPITAL_COMMUNITY): Payer: Self-pay | Admitting: Emergency Medicine

## 2018-07-15 ENCOUNTER — Emergency Department (HOSPITAL_COMMUNITY)
Admission: EM | Admit: 2018-07-15 | Discharge: 2018-07-15 | Disposition: A | Discharge status: Home / Self Care | Payer: Self-pay | Attending: Emergency Medicine | Admitting: Emergency Medicine

## 2018-07-15 DIAGNOSIS — J020 Streptococcal pharyngitis: Secondary | ICD-10-CM | POA: Insufficient documentation

## 2018-07-15 LAB — GROUP A STREP BY PCR: GROUP A STREP BY PCR: DETECTED — AB

## 2018-07-15 MED ORDER — LIDOCAINE VISCOUS HCL 2 % MT SOLN: 15.0000 mL | OROMUCOSAL | 0 refills | Status: AC | PRN

## 2018-07-15 MED ORDER — AMOXICILLIN 500 MG PO CAPS: 1000.0000 mg | ORAL_CAPSULE | Freq: Every day | ORAL | 0 refills | Status: AC

## 2018-07-15 MED ORDER — LIDOCAINE VISCOUS HCL 2 % MT SOLN
15.0000 mL | Freq: Once | OROMUCOSAL | Status: AC
  Administered 2018-07-15: 15 mL via OROMUCOSAL
  Filled 2018-07-15: qty 15

## 2018-07-15 NOTE — ED Triage Notes (Signed)
Pt c/o sore throat and fever x several days.

## 2018-07-15 NOTE — Discharge Instructions (Addendum)
Your strep test is positive. You will need to take antibiotics for the next 10 days. After starting antibiotics, you will be contagious for the next 1-2 days after that so be mindful of who you come in contact with.  Be sure to take the full amount of medication even if you start to feel better. You want to be sure that the entire infection is treated. I have also given you a prescription for lidocaine that you can gargle with to help numb the pain.  Thank you for allowing me to take care of you today!

## 2018-07-15 NOTE — ED Provider Notes (Signed)
[facility] Provider Note  CSN: [id number] Arrival date & time: 07/15/18  0815  History   Chief Complaint Chief Complaint  Patient presents with  . Sore Throat    HPI Jeffery Watts is a 32 y.o. male with no significant medical history who presented to the ED for sore throat. Associated symptoms include pain while swallowing and subjective fever. Denies dysphagia, neck pain, decreased neck ROM, drooling, dyspnea, skin rash, arthralgias or GU complaints. Onset of symptoms was 4 days ago, gradually worsening since that time.  He is drinking moderate amounts of fluids. He has had recent close exposure to someone with proven streptococcal pharyngitis.      History reviewed. No pertinent past medical history.  Patient Active Problem List   Diagnosis Date Noted  . Substance induced mood disorder (HCC) 03/29/2018    History reviewed. No pertinent surgical history.      Home Medications    Prior to Admission medications   Medication Sig Start Date End Date Taking? Authorizing Provider  amoxicillin (AMOXIL) 500 MG capsule Take 2 capsules (1,000 mg total) by mouth daily for 10 days. 07/15/18 07/25/18  Eutha Cude, Jerrel Ivory I, PA-C  amoxicillin-clavulanate (AUGMENTIN) 875-125 MG tablet Take 1 tablet by mouth 2 (two) times daily. One po bid x 7 days 04/24/18   Fayrene Helper, PA-C  calamine lotion Apply 1 application topically as needed for itching. 04/01/18   Armandina Stammer I, NP  cyclobenzaprine (FLEXERIL) 10 MG tablet Take 1 tablet (10 mg total) by mouth at bedtime as needed for muscle spasms. 07/03/18   Robinson, [place] N, PA-C  hydrocortisone 2.5 % lotion Apply topically 2 (two) times daily. For itching 04/01/18   Armandina Stammer I, NP  hydrOXYzine (ATARAX/VISTARIL) 25 MG tablet Take 1 tablet (25 mg total) by mouth every 6 (six) hours as needed for anxiety. 04/01/18   Armandina Stammer I, NP  ibuprofen (ADVIL,MOTRIN) 600 MG tablet Take 1 tablet (600 mg total) by mouth  every 6 (six) hours as needed. 04/24/18   Fayrene Helper, PA-C    Family History No family history on file.  Social History Social History   Tobacco Use  . Smoking status: Smoker, Current Status Unknown    Types: Cigars  . Smokeless tobacco: Never Used  Substance Use Topics  . Alcohol use: Yes    Alcohol/week: 4.0 - 5.0 standard drinks    Types: 4 - 5 Cans of beer per week    Comment: occasionally  . Drug use: Yes    Types: Marijuana, Cocaine    Comment: pt reoprts THC daily     Allergies   Tylenol [acetaminophen]   Review of Systems Review of Systems  Constitutional: Positive for fever. Negative for chills.  HENT: Positive for sore throat. Negative for congestion, ear pain, trouble swallowing and voice change.   Eyes: Negative for pain and visual disturbance.  Respiratory: Negative for cough, choking and chest tightness.   Genitourinary: Negative.   Musculoskeletal: Negative.   Skin: Negative.   Hematological: Negative for adenopathy.   Physical Exam Updated Vital Signs BP (!) 141/79 (BP Location: Right Arm)   Pulse 75   Temp 98.9 F (37.2 C) (Oral)   Resp 16   Ht 5\' 9"  (1.753 m)   Wt 83 kg   SpO2 100%   BMI 27.02 kg/m   Physical Exam  Constitutional: He appears well-developed and well-nourished. He appears ill.  HENT:  Head: Normocephalic and atraumatic.  Right Ear: Tympanic membrane and ear  canal normal.  Left Ear: Tympanic membrane and ear canal normal.  Mouth/Throat: Uvula is midline and mucous membranes are normal. No oral lesions. No trismus in the jaw. No uvula swelling. Posterior oropharyngeal erythema present. No oropharyngeal exudate or tonsillar abscesses. Tonsils are 3+ on the right. No tonsillar exudate.  Right tonsil swollen and erythematous without exudate. No peritonsillar abscess.  Eyes: Pupils are equal, round, and reactive to light. EOM are normal.  Neck: Normal range of motion and full passive range of motion without pain. Neck supple. No  neck rigidity. Normal range of motion present.  Cardiovascular: Normal rate, regular rhythm, normal heart sounds, intact distal pulses and normal pulses.  Pulmonary/Chest: Effort normal and breath sounds normal. No respiratory distress.  Lymphadenopathy:       Head (right side): No submental, no submandibular, no tonsillar, no preauricular, no posterior auricular and no occipital adenopathy present.       Head (left side): No submental, no submandibular, no tonsillar, no preauricular, no posterior auricular and no occipital adenopathy present.    He has no cervical adenopathy.  Skin: Skin is warm. Capillary refill takes less than 2 seconds. No rash noted.  Nursing note and vitals reviewed.  ED Treatments / Results  Labs (all labs ordered are listed, but only abnormal results are displayed) Labs Reviewed  GROUP A STREP BY PCR - Abnormal; Notable for the following components:      Result Value   Group A Strep by PCR DETECTED (*)    All other components within normal limits    EKG None  Radiology No results found.  Procedures Procedures (including critical care time)  Medications Ordered in ED Medications  lidocaine (XYLOCAINE) 2 % viscous mouth solution 15 mL (has no administration in time range)     Initial Impression / Assessment and Plan / ED Course  Triage vital signs and the nursing notes have been reviewed.  Pertinent labs & imaging results that were available during care of the patient were reviewed and considered in medical decision making (see chart for details).  Patient presents afebrile with complaints of 4-day history of sore throat. He states he has known sick contact and that his wife is currently being treated for strep throat. Physical exam shows enlarged, erythematous right tonsil. There is no uvula deviation, peritonsillar abscess or any s/s that raise concern for a retropharyngeal process. Patient is able to phonate and breathe normally with normal neck ROM  which is reassuring.  Clinical Course as of Jul 15 957  Tue Jul 15, 2018  0912 Strep positive.   [GM]    Clinical Course User Index [GM] Lavontay Kirk, Sharyon Medicus, PA-C    Final Clinical Impressions(s) / ED Diagnoses  1. Strep Pharyngitis. Rx for Amoxicillin x10 days prescribed. Education provided on OTC and supportive treatment for symptom relief. Education provided on red flag s/s that warrant sooner follow-up with a medical provider.  Dispo: Home. After thorough clinical evaluation, this patient is determined to be medically stable and can be safely discharged with the previously mentioned treatment and/or outpatient follow-up/referral(s). At this time, there are no other apparent medical conditions that require further screening, evaluation or treatment.   Final diagnoses:  Strep throat    ED Discharge Orders         Ordered    amoxicillin (AMOXIL) 500 MG capsule  Daily     07/15/18 0956            Dagoberto Ligas I, PA-C 07/15/18 1019  Charlynne Pander, MD 07/15/18 [phone]

## 2021-12-20 ENCOUNTER — Other Ambulatory Visit: Payer: Self-pay

## 2021-12-20 ENCOUNTER — Encounter (HOSPITAL_COMMUNITY): Payer: Self-pay | Admitting: Oncology

## 2021-12-20 ENCOUNTER — Emergency Department (HOSPITAL_COMMUNITY)
Admission: EM | Admit: 2021-12-20 | Discharge: 2021-12-20 | Disposition: A | Discharge status: Home / Self Care | Payer: Self-pay | Attending: Emergency Medicine | Admitting: Emergency Medicine

## 2021-12-20 ENCOUNTER — Emergency Department (HOSPITAL_COMMUNITY): Payer: Self-pay

## 2021-12-20 DIAGNOSIS — M5126 Other intervertebral disc displacement, lumbar region: Secondary | ICD-10-CM | POA: Insufficient documentation

## 2021-12-20 MED ORDER — METHOCARBAMOL 500 MG PO TABS
500.0000 mg | ORAL_TABLET | Freq: Once | ORAL | Status: AC
  Administered 2021-12-20: 500 mg via ORAL
  Filled 2021-12-20: qty 1

## 2021-12-20 MED ORDER — OXYCODONE-ACETAMINOPHEN 5-325 MG PO TABS
1.0000 | ORAL_TABLET | Freq: Once | ORAL | Status: AC
  Administered 2021-12-20: 1 via ORAL
  Filled 2021-12-20: qty 1

## 2021-12-20 MED ORDER — IBUPROFEN 800 MG PO TABS
800.0000 mg | ORAL_TABLET | Freq: Once | ORAL | Status: AC
  Administered 2021-12-20: 800 mg via ORAL
  Filled 2021-12-20: qty 1

## 2021-12-20 MED ORDER — OXYCODONE-ACETAMINOPHEN 5-325 MG PO TABS: 1.0000 | ORAL_TABLET | Freq: Four times a day (QID) | ORAL | 0 refills | Status: AC | PRN

## 2021-12-20 NOTE — Discharge Instructions (Signed)
You had mild bulging of a couple of the discs in your lower back likely from when you strained yourself while playing basketball the other day.  Begin you a couple tablets of pain medication to get you through the worst of it, however this will likely resolve in a week or 2.  Continue to rest, gentle stretching.  Keep alternating Tylenol and Motrin to help keep the pain at [place] and it should feel better soon.  If it does not, you can follow-up with the spinal surgeon referral provided above given that you do not have a primary care doctor. ? ?Please do light duty at work until you start to feel better as this can aggravate your symptoms and make it worse.  I have provided you a note.  If you develop numbness and tingling of the groin or find that you have urinated or defecated on yourself, please return to the ED for evaluation emergently. ?

## 2021-12-20 NOTE — ED Triage Notes (Addendum)
Pt reports left lower back pain that radiates down left leg. Pain began 3-4 days ago while playing basketball.  Rates pain 9/10 w/ certain movements. States standing feels better than sitting.  ?

## 2021-12-20 NOTE — ED Provider Notes (Signed)
[facility]-EMERGENCY DEPT Provider Note   CSN: [id number] Arrival date & time: 12/20/21  1017     History  Chief Complaint  Patient presents with   Back Pain    Jeffery Watts is a 36 y.o. male who presents to the ED for evaluation of lower back pain with radiation down the left lower leg that occurred after an injury while playing basketball for 5 days ago.  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  He has been resting at home without any significant improvement in symptoms.  No other treatment prior to arrival.  He notes the pain seems to be shooting down into his left buttock and down the posterior thigh.  Pain is worse when standing up from sitting, bending over, standing up for too long or lying flat on his back.  The only relief he gets is from constant minor position changes.  Patient is concerned as he is very active in his job also involves a lot of lifting.  He denies saddle paresthesia, bladder and bowel dysfunction.  He has no other complaints.   Back Pain     Home Medications Prior to Admission medications   Medication Sig Start Date End Date Taking? Authorizing Provider  oxyCODONE-acetaminophen (PERCOCET/ROXICET) 5-325 MG tablet Take 1 tablet by mouth every 6 (six) hours as needed for severe pain. 12/20/21  Yes Raynald Blend R, PA-C  amoxicillin-clavulanate (AUGMENTIN) 875-125 MG tablet Take 1 tablet by mouth 2 (two) times daily. One po bid x 7 days 04/24/18   Fayrene Helper, PA-C  calamine lotion Apply 1 application topically as needed for itching. 04/01/18   Armandina Stammer I, NP  cyclobenzaprine (FLEXERIL) 10 MG tablet Take 1 tablet (10 mg total) by mouth at bedtime as needed for muscle spasms. 07/03/18   Robinson, [place] N, PA-C  hydrocortisone 2.5 % lotion Apply topically 2 (two) times daily. For itching 04/01/18   Armandina Stammer I, NP  hydrOXYzine (ATARAX/VISTARIL) 25 MG tablet Take  1 tablet (25 mg total) by mouth every 6 (six) hours as needed for anxiety. 04/01/18   Armandina Stammer I, NP  ibuprofen (ADVIL,MOTRIN) 600 MG tablet Take 1 tablet (600 mg total) by mouth every 6 (six) hours as needed. 04/24/18   Fayrene Helper, PA-C      Allergies    Tylenol [acetaminophen]    Review of Systems   Review of Systems  Musculoskeletal:  Positive for back pain.   Physical Exam Updated Vital Signs BP (!) 129/96 (BP Location: Right Arm)    Pulse (!) 50    Temp 97.9 F (36.6 C) (Oral)    Resp 14    Ht 5\' 3"  (1.6 m)    Wt 77.1 kg    SpO2 100%    BMI 30.11 kg/m  Physical Exam Vitals and nursing note reviewed.  Constitutional:      General: He is not in acute distress.    Appearance: He is not ill-appearing.  HENT:     Head: Atraumatic.  Eyes:     Conjunctiva/sclera: Conjunctivae normal.  Cardiovascular:     Rate and Rhythm: Normal rate and regular rhythm.     Pulses: Normal pulses.     Heart sounds: No murmur heard. Pulmonary:     Effort: Pulmonary effort is normal. No respiratory distress.     Breath sounds: Normal breath sounds.  Abdominal:     General: Abdomen is  flat. There is no distension.     Palpations: Abdomen is soft.     Tenderness: There is no abdominal tenderness.  Musculoskeletal:        General: Normal range of motion.     Cervical back: Normal range of motion.     Comments: Mild tenderness to palpation at midline L-spine.  Positive straight leg raise of left leg  Skin:    General: Skin is warm and dry.     Capillary Refill: Capillary refill takes less than 2 seconds.  Neurological:     General: No focal deficit present.     Mental Status: He is alert.  Psychiatric:        Mood and Affect: Mood normal.    ED Results / Procedures / Treatments   Labs (all labs ordered are listed, but only abnormal results are displayed) Labs Reviewed - No data to display  EKG None  Radiology CT Thoracic Spine Wo Contrast  Result Date: 12/20/2021 CLINICAL DATA:   Mid back pain. Injury. Lumbar radiculopathy. Left low back pain radiating down the left leg. EXAM: CT THORACIC AND LUMBAR SPINE WITHOUT CONTRAST TECHNIQUE: Multidetector CT imaging of the thoracic and lumbar spine was performed without contrast. Multiplanar CT image reconstructions were also generated. RADIATION DOSE REDUCTION: This exam was performed according to the departmental dose-optimization program which includes automated exposure control, adjustment of the mA and/or kV according to patient size and/or use of iterative reconstruction technique. COMPARISON:  None. FINDINGS: CT THORACIC SPINE FINDINGS Alignment: Normal. Vertebrae: No fracture or suspicious osseous lesion. Paraspinal and other soft tissues: Unremarkable. Disc levels: Minor thoracic spondylosis with anterior vertebral spurring most notable at T6-7. No evidence of significant stenosis on this non-myelographic examination. CT LUMBAR SPINE FINDINGS Segmentation: 5 lumbar type vertebrae.  Hypoplastic ribs at T12. Alignment: Normal. Vertebrae: No fracture or suspicious osseous lesion. Paraspinal and other soft tissues: Unremarkable. Disc levels: Preserved disc space heights. T12-L1 through L3-4: Negative. L4-5: Mild disc bulging and possible superimposed small central disc protrusion without evidence of compressive stenosis. L5-S1: Minimal disc bulging and minor facet arthrosis without stenosis. IMPRESSION: 1. No acute osseous abnormality in the thoracic or lumbar spine. 2. Mild lumbar spondylosis without evidence of compressive stenosis. Electronically Signed   By: Sebastian Ache M.D.   On: 12/20/2021 12:58   CT Lumbar Spine Wo Contrast  Result Date: 12/20/2021 CLINICAL DATA:  Mid back pain. Injury. Lumbar radiculopathy. Left low back pain radiating down the left leg. EXAM: CT THORACIC AND LUMBAR SPINE WITHOUT CONTRAST TECHNIQUE: Multidetector CT imaging of the thoracic and lumbar spine was performed without contrast. Multiplanar CT image  reconstructions were also generated. RADIATION DOSE REDUCTION: This exam was performed according to the departmental dose-optimization program which includes automated exposure control, adjustment of the mA and/or kV according to patient size and/or use of iterative reconstruction technique. COMPARISON:  None. FINDINGS: CT THORACIC SPINE FINDINGS Alignment: Normal. Vertebrae: No fracture or suspicious osseous lesion. Paraspinal and other soft tissues: Unremarkable. Disc levels: Minor thoracic spondylosis with anterior vertebral spurring most notable at T6-7. No evidence of significant stenosis on this non-myelographic examination. CT LUMBAR SPINE FINDINGS Segmentation: 5 lumbar type vertebrae.  Hypoplastic ribs at T12. Alignment: Normal. Vertebrae: No fracture or suspicious osseous lesion. Paraspinal and other soft tissues: Unremarkable. Disc levels: Preserved disc space heights. T12-L1 through L3-4: Negative. L4-5: Mild disc bulging and possible superimposed small central disc protrusion without evidence of compressive stenosis. L5-S1: Minimal disc bulging and minor facet arthrosis without stenosis. IMPRESSION:  1. No acute osseous abnormality in the thoracic or lumbar spine. 2. Mild lumbar spondylosis without evidence of compressive stenosis. Electronically Signed   By: Sebastian AcheAllen  Grady M.D.   On: 12/20/2021 12:58    Procedures Procedures    Medications Ordered in ED Medications  ibuprofen (ADVIL) tablet 800 mg (800 mg Oral Given 12/20/21 1145)  methocarbamol (ROBAXIN) tablet 500 mg (500 mg Oral Given 12/20/21 1145)  oxyCODONE-acetaminophen (PERCOCET/ROXICET) 5-325 MG per tablet 1 tablet (1 tablet Oral Given 12/20/21 1323)    ED Course/ Medical Decision Making/ A&P                           Medical Decision Making Amount and/or Complexity of Data Reviewed Radiology: ordered.  Risk Prescription drug management.   History:  Per HPI Social determinants of health: no PCP  Initial impression:  This  patient presents to the ED for concern of low back pain with radicular symptoms, this involves an extensive number of treatment options, and is a complaint that carries with it a high risk of complications and morbidity.   Emergent considerations in the differential diagnosis of back pain include:occult fracture, congenital anomalies, tumors, vascular catastrophes, osteomyelitis of vertebrae, infections of disc, meninges or cord, space occupying lesions within canal leading to cord or root compression including epidural abscess. 36 year old overall well-appearing male, nontoxic appearing.  He is very uncomfortable but is ambulatory during my exam.  He has some lumbar midline tenderness with positive straight leg raise.  Will obtain CT lumbar to assess for herniated disc and provide pain management.   Imaging Studies ordered:  I ordered imaging studies including  CT lumbar spine and CT T-spine with mild disc bulging between L3-L4 and L4-L5. I independently visualized and interpreted imaging and I agree with the radiologist interpretation.    Medicines ordered and prescription drug management:  I ordered medication including: Motrin 800 mg p.o. Robaxin 500 mg p.o. Percocet p.o. Reevaluation of the patient after these medicines showed that the patient improved I have reviewed the patients home medicines and have made adjustments as needed   Disposition:  After consideration of the diagnostic results, physical exam, history and the patients response to treatment feel that the patent would benefit from discharge.   Lumbar herniated disc: Discussed conservative measures with patient and advised light duty at work and to minimize reinjury over the next week or 2.  Patient expresses understanding.  We will give him a few tablets of pain medication to get through the worst of the pain over the next day or 2.  Return precautions were discussed.  Discharge all questions were asked and answered and he  was discharged home in good condition   Final Clinical Impression(s) / ED Diagnoses Final diagnoses:  Lumbar herniated disc    Rx / DC Orders ED Discharge Orders          Ordered    oxyCODONE-acetaminophen (PERCOCET/ROXICET) 5-325 MG tablet  Every 6 hours PRN        12/20/21 1318              Janell QuietConklin, Thurmon Mizell R, [place] JerseyPA-C 12/20/21 1332    Bethann BerkshireZammit, Joseph, MD 12/20/21 1608

## 2022-03-08 ENCOUNTER — Ambulatory Visit: Payer: Self-pay

## 2022-04-04 IMAGING — CT CT T SPINE W/O CM
3 of 4 series · 10 of 33 positions shown, 12 images · non-contrast
Comparison: None.

CLINICAL DATA: Mid back pain. Injury. Lumbar radiculopathy. Left
low back pain radiating down the left leg.

EXAM:
CT THORACIC AND LUMBAR SPINE WITHOUT CONTRAST
TECHNIQUE: Multidetector CT imaging of the thoracic and lumbar spine was
performed without contrast. Multiplanar CT image reconstructions
were also generated.
RADIATION DOSE REDUCTION: This exam was performed according to the
departmental dose-optimization program which includes automated
exposure control, adjustment of the mA and/or kV according to
patient size and/or use of iterative reconstruction technique.

[Series 4: t spine st · axial · 0.37mm/px · z∈[-222,-108]mm · 2 of 173 slices shown, 3 images]
[im 58/173  soft-tissue]
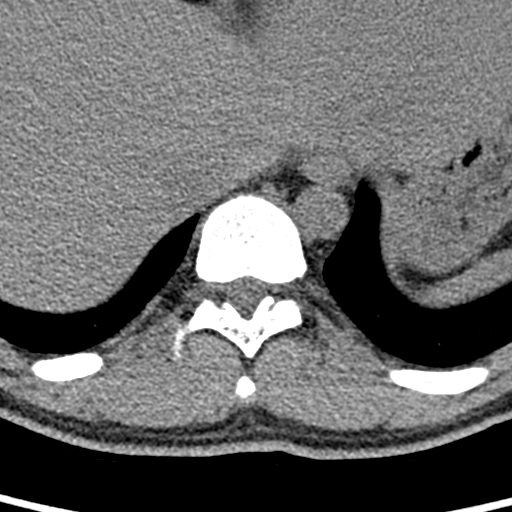
[im 58/173  bone]
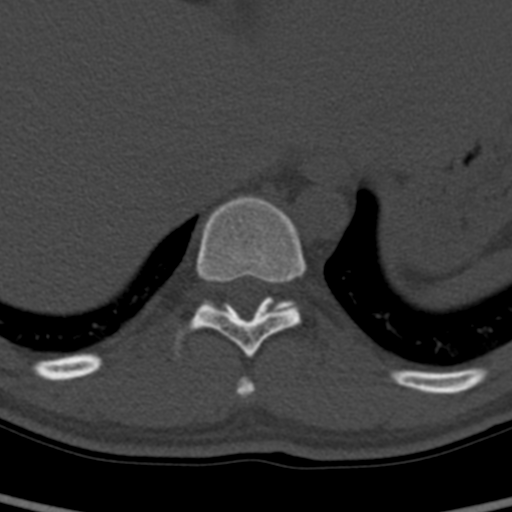
[im 115/173  bone]
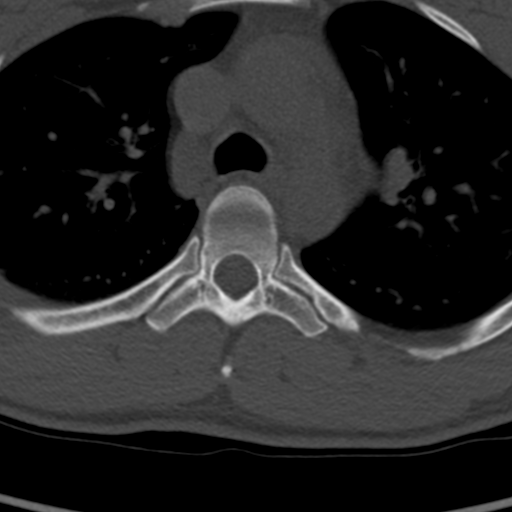

[Series 8: coronal bone · coronal · 0.38mm/px · 3 of 74 slices shown]
[im 15/74  bone]
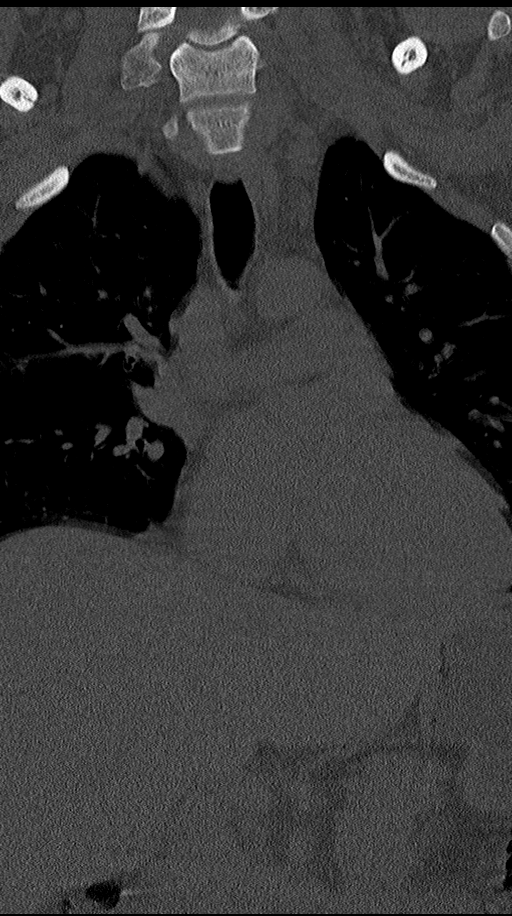
[im 30/74  bone]
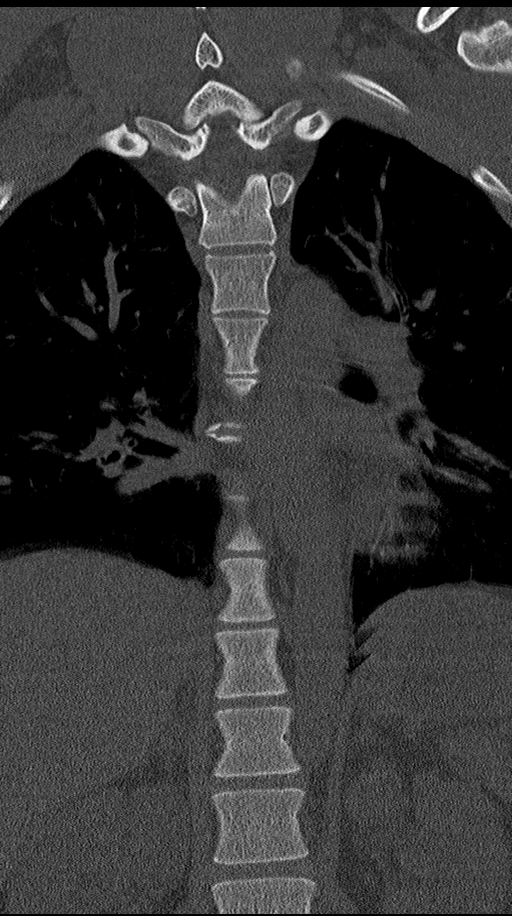
[im 44/74  bone]
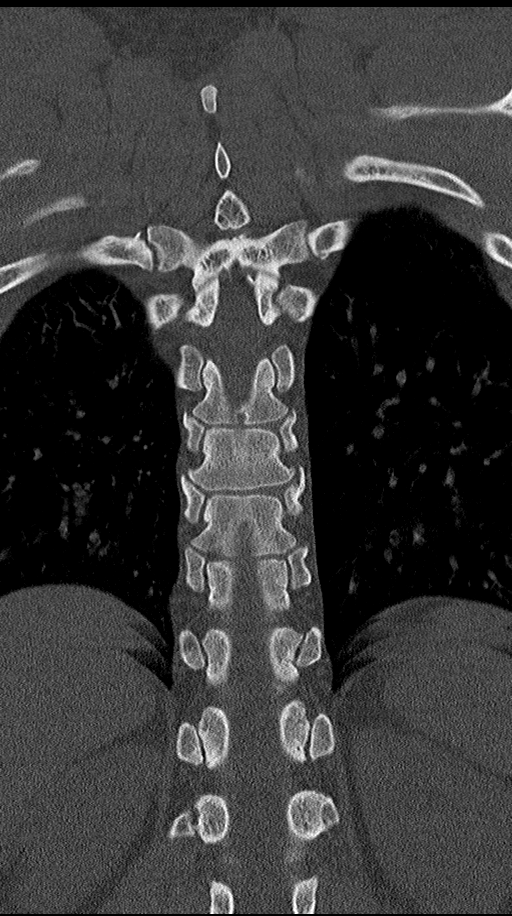

[Series 9: sagittal bone · sagittal · 0.35mm/px · 5 of 61 slices shown, 6 images]
[im 21/61  bone]
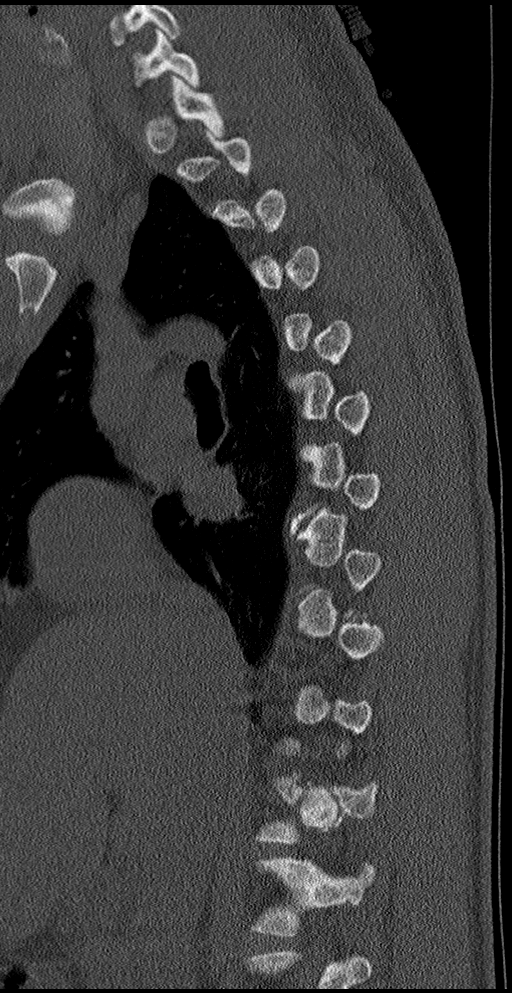
[im 26/61  bone]
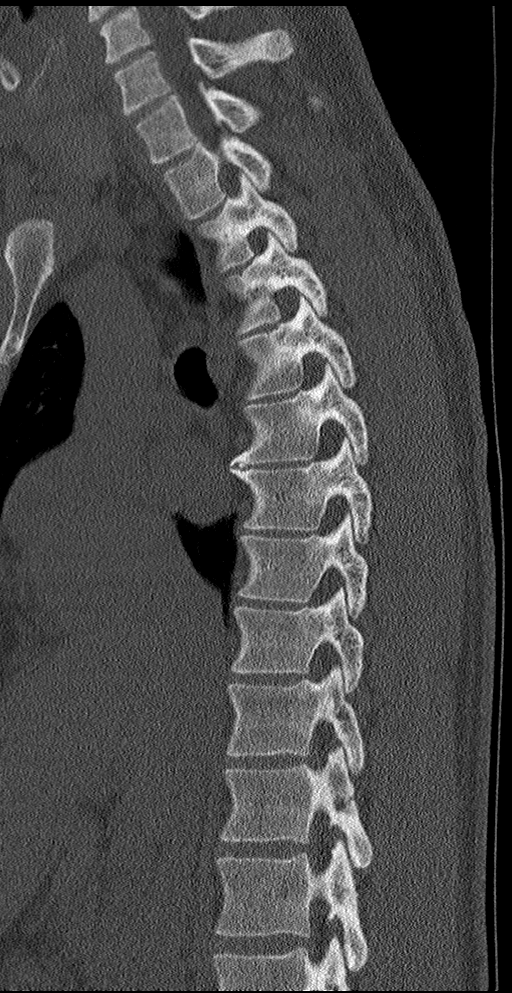
[im 31/61  soft-tissue]
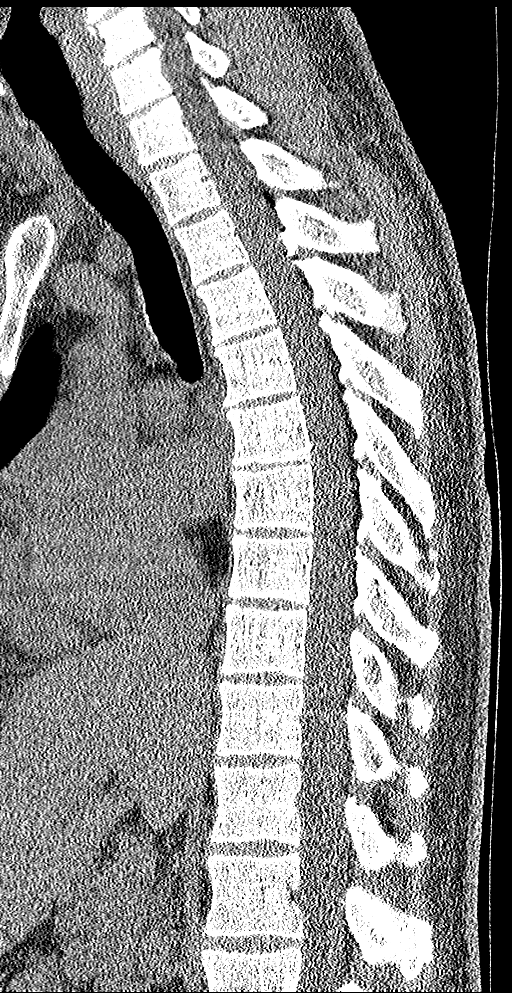
[im 31/61  bone]
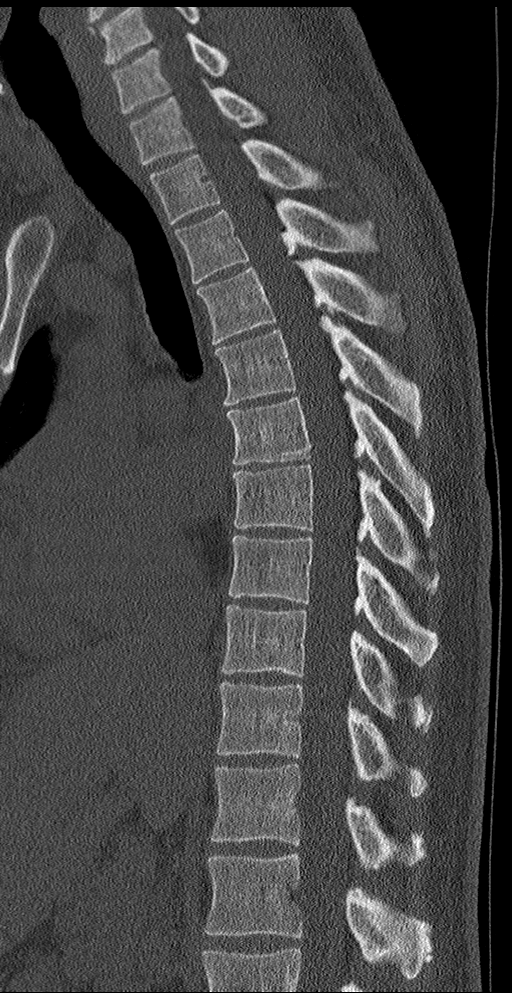
[im 36/61  bone]
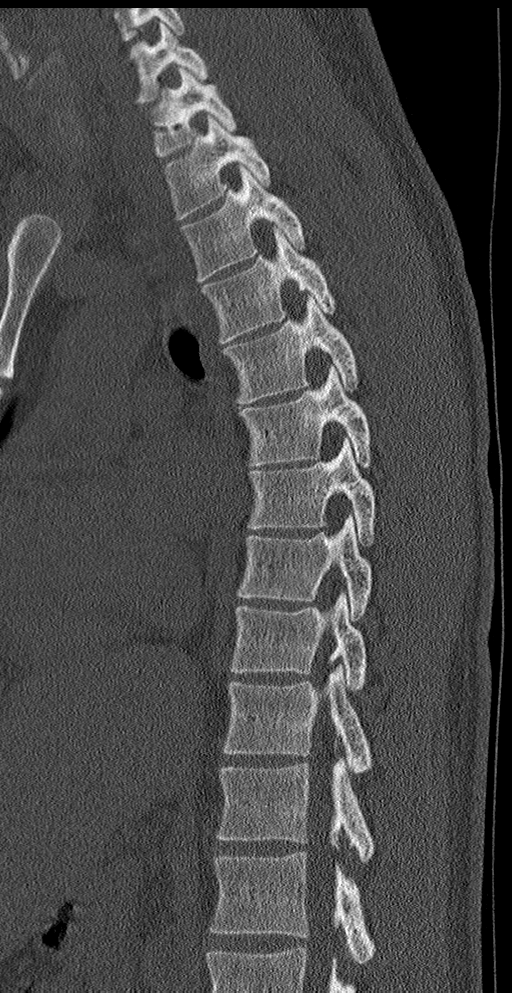
[im 41/61  bone]
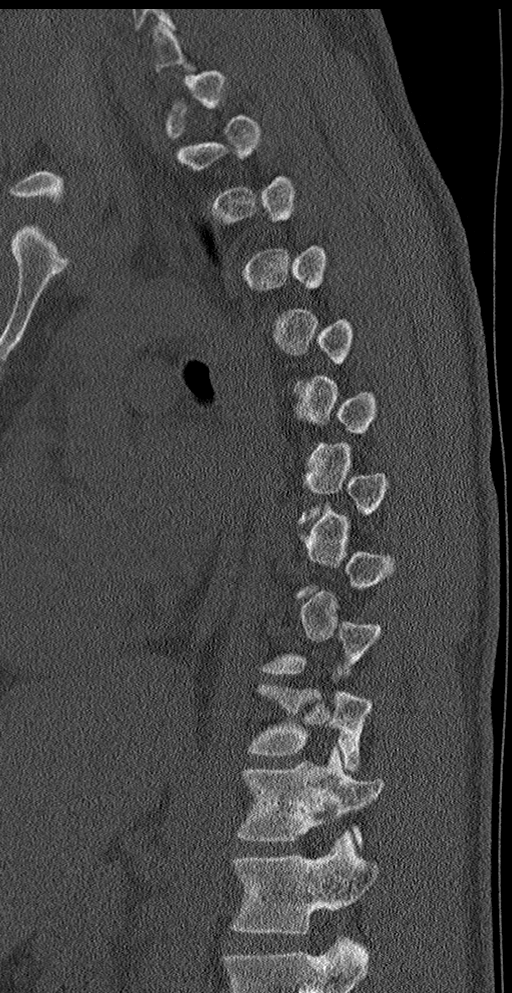

[10 of 33 positions shown; findings below may reference images not displayed]

FINDINGS: CT THORACIC SPINE FINDINGS

Alignment: Normal.

Vertebrae: No fracture or suspicious osseous lesion.

Paraspinal and other soft tissues: Unremarkable.

Disc levels: Minor thoracic spondylosis with anterior vertebral
spurring most notable at T6-7. No evidence of significant stenosis
on this non-myelographic examination.

CT LUMBAR SPINE FINDINGS

Segmentation: 5 lumbar type vertebrae.  Hypoplastic ribs at T12.

Alignment: Normal.

Vertebrae: No fracture or suspicious osseous lesion.

Paraspinal and other soft tissues: Unremarkable.

Disc levels: Preserved disc space heights.

T12-L1 through L3-4: Negative.

L4-5: Mild disc bulging and possible superimposed small central disc
protrusion without evidence of compressive stenosis.

L5-S1: Minimal disc bulging and minor facet arthrosis without
stenosis.
IMPRESSION: 1. No acute osseous abnormality in the thoracic or lumbar spine.
2. Mild lumbar spondylosis without evidence of compressive stenosis.

## 2022-04-04 IMAGING — CT CT L SPINE W/O CM
3 of 4 series · 12 of 33 positions shown, 14 images · non-contrast
Comparison: None.

CLINICAL DATA: Mid back pain. Injury. Lumbar radiculopathy. Left
low back pain radiating down the left leg.

EXAM:
CT THORACIC AND LUMBAR SPINE WITHOUT CONTRAST
TECHNIQUE: Multidetector CT imaging of the thoracic and lumbar spine was
performed without contrast. Multiplanar CT image reconstructions
were also generated.
RADIATION DOSE REDUCTION: This exam was performed according to the
departmental dose-optimization program which includes automated
exposure control, adjustment of the mA and/or kV according to
patient size and/or use of iterative reconstruction technique.

[Series 4: l spine st · axial · 0.23mm/px · z∈[-486,-314]mm · 4 of 130 slices shown, 5 images]
[im 22/130  soft-tissue]
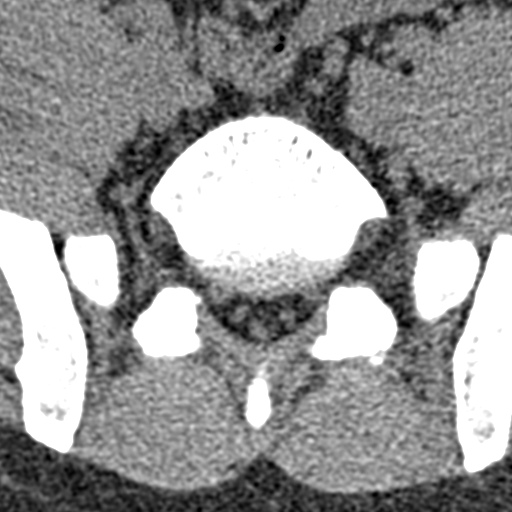
[im 22/130  bone]
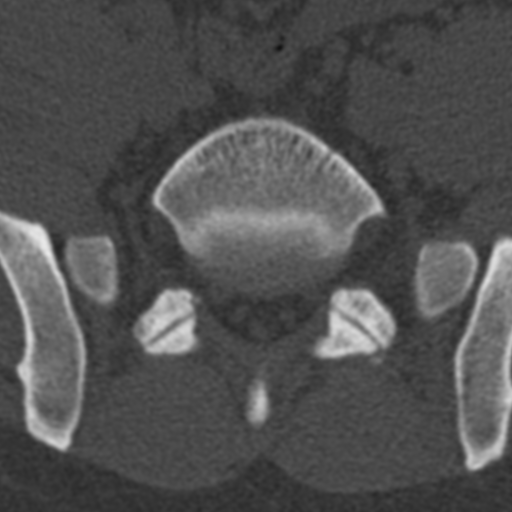
[im 44/130  bone]
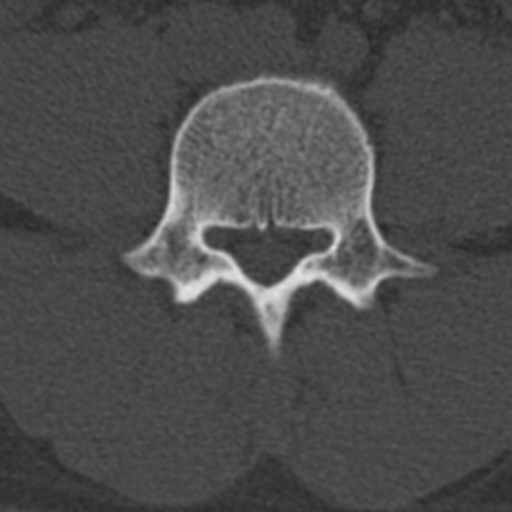
[im 87/130  bone]
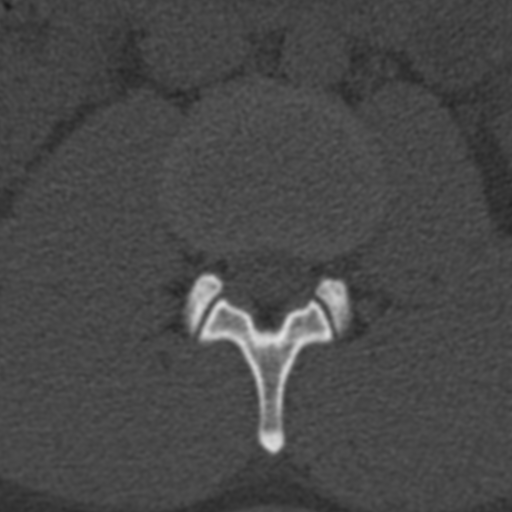
[im 108/130  bone]
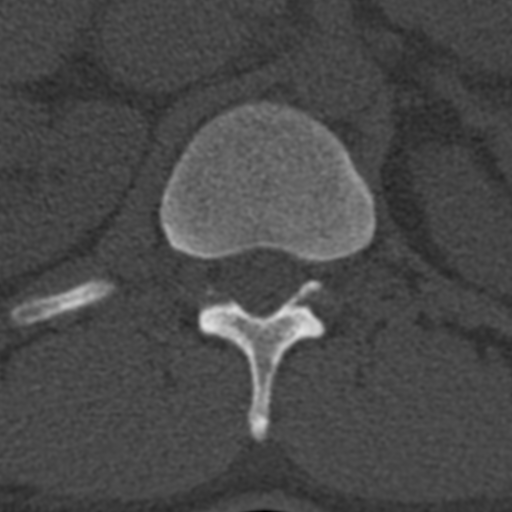

[Series 8: coronal bone · coronal · 0.38mm/px · 3 of 65 slices shown]
[im 13/65  bone]
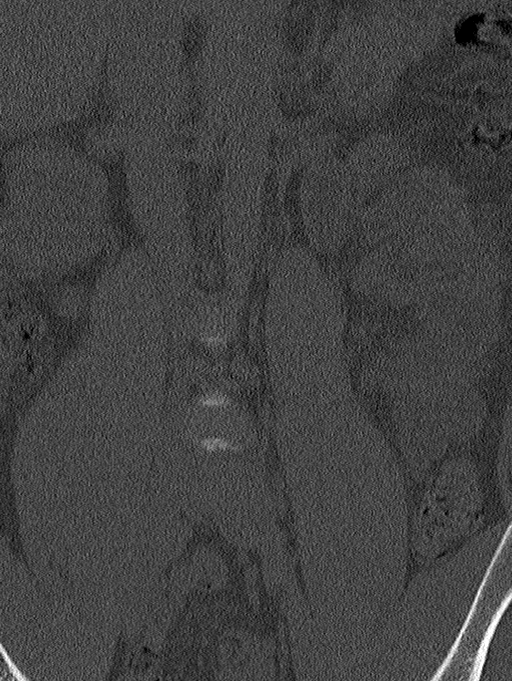
[im 26/65  bone]
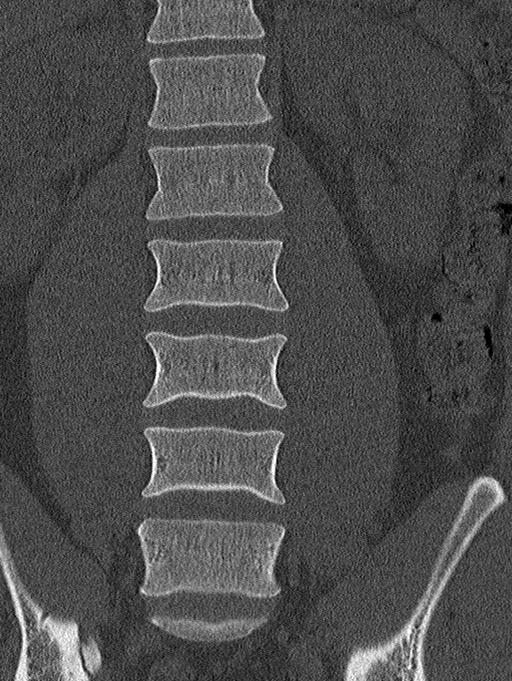
[im 39/65  bone]
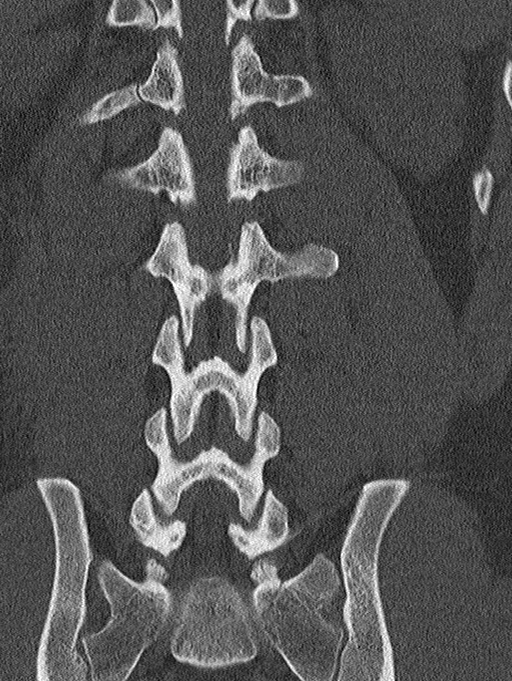

[Series 10: sagittal st · sagittal · 0.30mm/px · 5 of 61 slices shown, 6 images]
[im 21/61  bone]
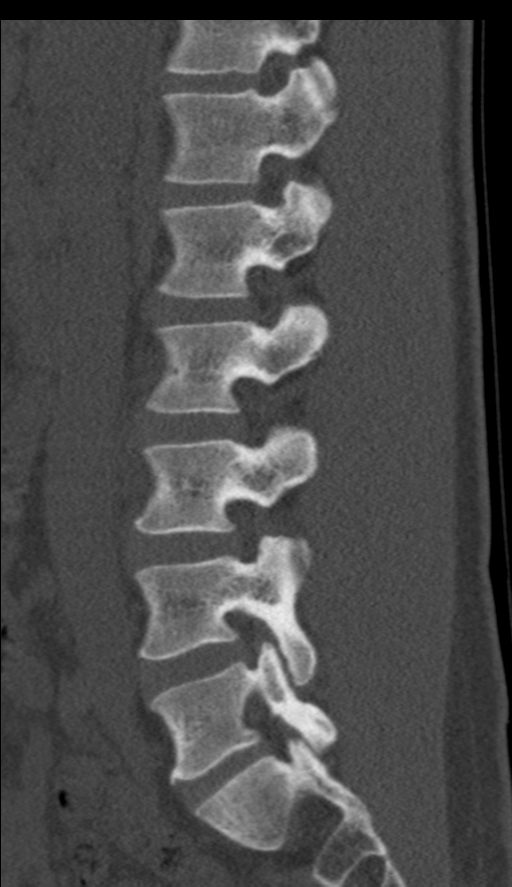
[im 26/61  bone]
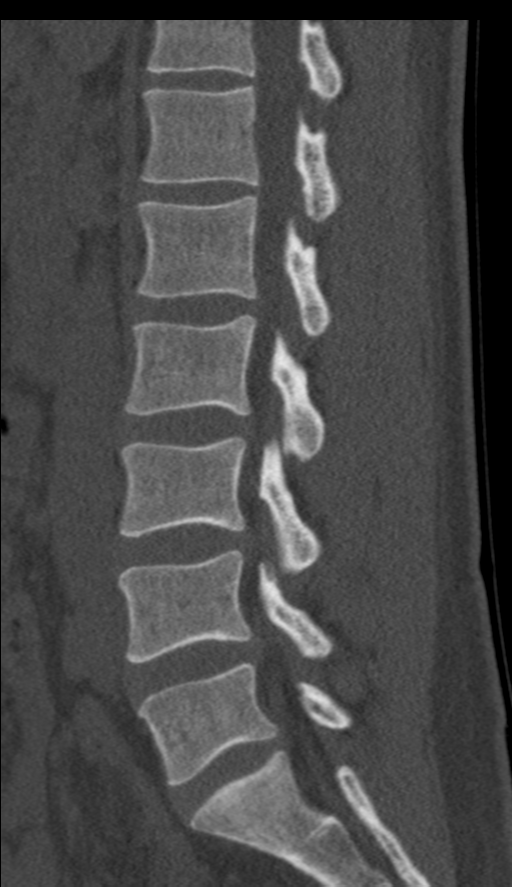
[im 31/61  soft-tissue]
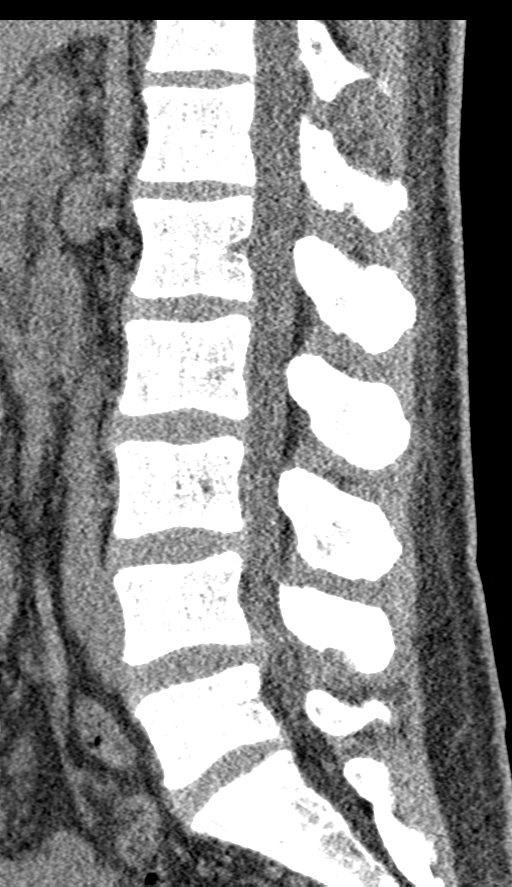
[im 31/61  bone]
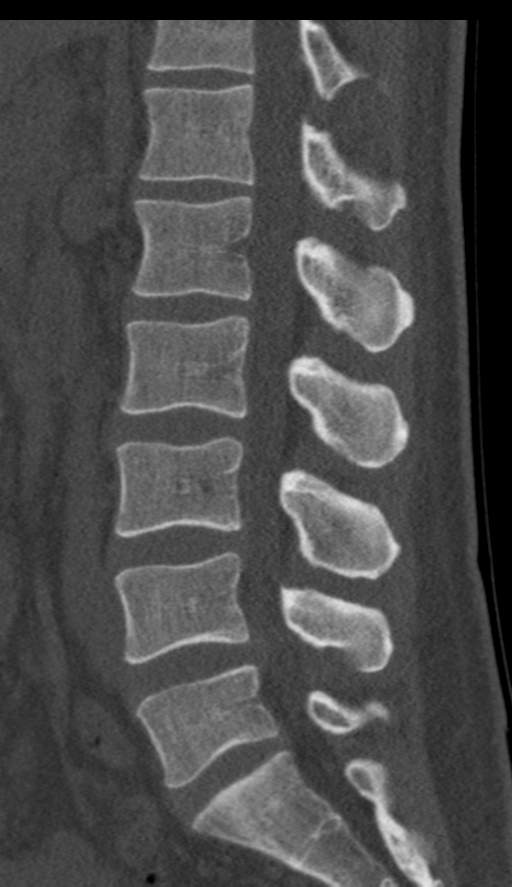
[im 36/61  bone]
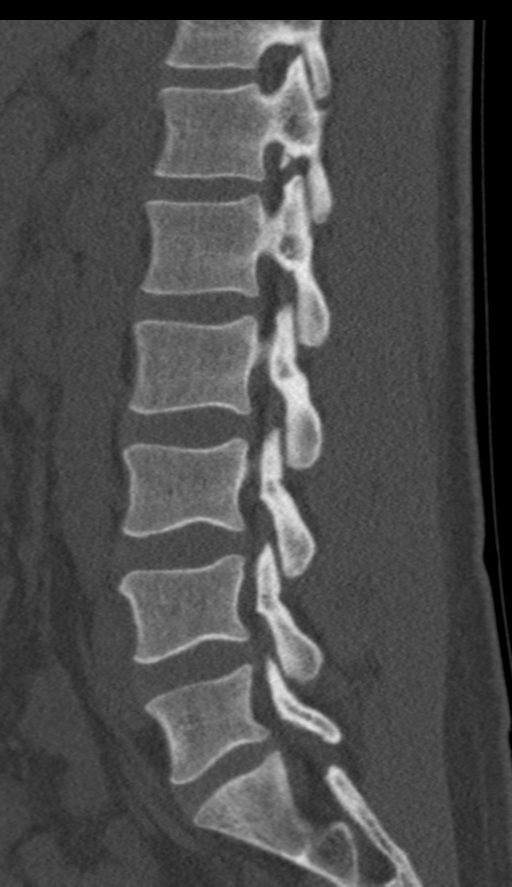
[im 41/61  bone]
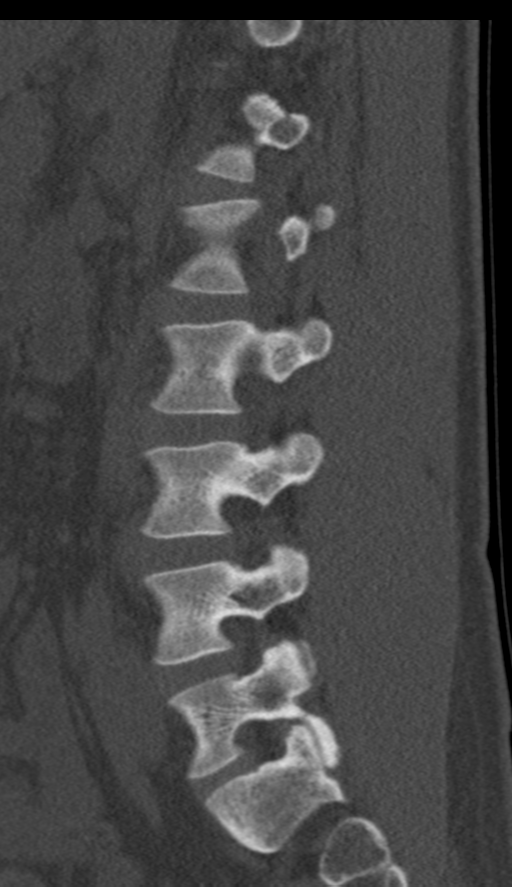

[12 of 33 positions shown; findings below may reference images not displayed]

FINDINGS: CT THORACIC SPINE FINDINGS

Alignment: Normal.

Vertebrae: No fracture or suspicious osseous lesion.

Paraspinal and other soft tissues: Unremarkable.

Disc levels: Minor thoracic spondylosis with anterior vertebral
spurring most notable at T6-7. No evidence of significant stenosis
on this non-myelographic examination.

CT LUMBAR SPINE FINDINGS

Segmentation: 5 lumbar type vertebrae.  Hypoplastic ribs at T12.

Alignment: Normal.

Vertebrae: No fracture or suspicious osseous lesion.

Paraspinal and other soft tissues: Unremarkable.

Disc levels: Preserved disc space heights.

T12-L1 through L3-4: Negative.

L4-5: Mild disc bulging and possible superimposed small central disc
protrusion without evidence of compressive stenosis.

L5-S1: Minimal disc bulging and minor facet arthrosis without
stenosis.
IMPRESSION: 1. No acute osseous abnormality in the thoracic or lumbar spine.
2. Mild lumbar spondylosis without evidence of compressive stenosis.

## 2023-12-20 ENCOUNTER — Emergency Department (HOSPITAL_COMMUNITY)
Admission: EM | Admit: 2023-12-20 | Discharge: 2023-12-21 | Disposition: A | Discharge status: Home / Self Care | Payer: Self-pay | Attending: Emergency Medicine | Admitting: Emergency Medicine

## 2023-12-20 ENCOUNTER — Emergency Department (HOSPITAL_COMMUNITY): Payer: Self-pay

## 2023-12-20 ENCOUNTER — Other Ambulatory Visit: Payer: Self-pay

## 2023-12-20 ENCOUNTER — Encounter (HOSPITAL_COMMUNITY): Payer: Self-pay

## 2023-12-20 DIAGNOSIS — R519 Headache, unspecified: Secondary | ICD-10-CM | POA: Diagnosis not present

## 2023-12-20 DIAGNOSIS — M542 Cervicalgia: Secondary | ICD-10-CM | POA: Insufficient documentation

## 2023-12-20 DIAGNOSIS — M5430 Sciatica, unspecified side: Secondary | ICD-10-CM

## 2023-12-20 DIAGNOSIS — R32 Unspecified urinary incontinence: Secondary | ICD-10-CM | POA: Insufficient documentation

## 2023-12-20 DIAGNOSIS — M5126 Other intervertebral disc displacement, lumbar region: Secondary | ICD-10-CM | POA: Diagnosis not present

## 2023-12-20 DIAGNOSIS — M545 Low back pain, unspecified: Secondary | ICD-10-CM | POA: Diagnosis present

## 2023-12-20 DIAGNOSIS — Y92410 Unspecified street and highway as the place of occurrence of the external cause: Secondary | ICD-10-CM | POA: Insufficient documentation

## 2023-12-20 HISTORY — DX: Sciatica, unspecified side: M54.30

## 2023-12-20 HISTORY — DX: Other intervertebral disc displacement, lumbar region: M51.26

## 2023-12-20 MED ORDER — IBUPROFEN 800 MG PO TABS
800.0000 mg | ORAL_TABLET | Freq: Once | ORAL | Status: AC
  Administered 2023-12-20: 800 mg via ORAL
  Filled 2023-12-20: qty 1
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
## 2023-12-21 ENCOUNTER — Emergency Department (HOSPITAL_COMMUNITY)

## 2023-12-21 MED ORDER — SODIUM CHLORIDE 0.9 % IV BOLUS: 1000.0000 mL | Freq: Once | INTRAVENOUS | Status: DC

## 2023-12-21 MED ORDER — PREDNISONE 10 MG PO TABS: 40.0000 mg | ORAL_TABLET | Freq: Every day | ORAL | 0 refills | Status: AC

## 2023-12-21 MED ORDER — IOHEXOL 300 MG/ML  SOLN
100.0000 mL | Freq: Once | INTRAMUSCULAR | Status: AC | PRN
  Administered 2023-12-21: 100 mL via INTRAVENOUS

## 2023-12-21 MED ORDER — IBUPROFEN 200 MG PO TABS
600.0000 mg | ORAL_TABLET | Freq: Once | ORAL | Status: AC
  Administered 2023-12-21: 600 mg via ORAL
  Filled 2023-12-21: qty 3

## 2023-12-21 MED ORDER — MORPHINE SULFATE (PF) 4 MG/ML IV SOLN
4.0000 mg | Freq: Once | INTRAVENOUS | Status: DC
  Filled 2023-12-21: qty 1

## 2023-12-21 NOTE — ED Notes (Signed)
 Provider notified pt that he still required IV for contrast CT scan. Pt allowed EMT-P to obtain IV access, but pt still refused bloodwork and IV meds.

## 2023-12-21 NOTE — Discharge Instructions (Addendum)
 Return for any problem.  ?

## 2023-12-21 NOTE — ED Notes (Signed)
 PT approached and informed that DO ordered bloodwork and IV meds had been ordered. Pt stated he did not want blood work done and did not want to be stuck for an IV. Pt informed of the importance and purpose of bloodwork, and pt still refused. Pt asked if he could have PO medication instead of IV as he did not want an IV. Provider notified.

## 2023-12-21 NOTE — ED Notes (Signed)
 Provider talked to the pt about the importance of the contrast for the MRI. Pt agreed to have another IV placed. IV access attained, and pt transported to MRI via wheelchair.

## 2023-12-21 NOTE — ED Notes (Signed)
 When pt returned from CT, pt approached EMT-P with IV catheter and saline lock in hand. Pt had removed his IV since "CT was done with it". Minimal bleeding at site, and pt given bandaid for such.

## 2023-12-21 NOTE — ED Notes (Signed)
 At MRI, pt refused contrast. Pt was transported back to his bed, and pt requested his IV be removed. Pt informed that depending on provider discretion another IV might need to be placed in the future; pt acknowledged and still requested the IV be removed.

## 2023-12-21 NOTE — ED Notes (Signed)
 As previously charted, pt removed his own IV after CT scan. Provider ordered MRI with and without contrast, requiring pt have IV access again. Pt refusing another IV access be attained. Pt informed of the importance of such, and pt continued to refuse. Pt states he will do the MRI but without the contrast. Provider notified.

## 2023-12-21 NOTE — ED Notes (Signed)
 Patient transported to CT via wheelchair

## 2023-12-21 NOTE — ED Provider Notes (Signed)
 Patient seen after prior ED provider.  Per nursing , patient apparently ambulated from his bed in the hallway outside to the parking lot.  For at least 30 minutes he was not in the department.  I could not find the patient for reevaluation during this time.  Eventually, patient returned to his hallway bed.  Nursing staff informed me of his return.  Imaging studies discussed with the patient.  Patient is ambulatory.  Patient is without apparent weakness.  He is neurologically intact.  Patient understands imaging results.  He is advised to follow-up closely with spine in an outpatient setting.  Patient did ask for narcotic pain medication.  I advised him that this would not be appropriate.  Patient was interested in trialing a course of prednisone for his low back pain.  He is also instructed to use ibuprofen.  He reports an allergy to Tylenol.  At time of discharge patient is neurologically intact.  He appears comfortable.  He is ambulatory without antalgic gait.   Wynetta Fines, MD 12/21/23 [phone]
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
## 2024-06-01 ENCOUNTER — Ambulatory Visit: Payer: Self-pay | Admitting: Neurology

## 2024-06-01 ENCOUNTER — Telehealth: Payer: Self-pay | Admitting: Neurology

## 2024-06-01 NOTE — Telephone Encounter (Signed)
 [facility] conf
